# Patient Record
Sex: Male | Born: 1950
Health system: Southern US, Community
[De-identification: ages and names within clinical notes are randomized; demographics above are authoritative.]

---

## 1999-07-07 ENCOUNTER — Encounter: Admission: RE | Admit: 1999-07-07 | Discharge: 1999-07-07 | Payer: Self-pay | Admitting: *Deleted

## 2006-01-09 ENCOUNTER — Emergency Department (HOSPITAL_COMMUNITY): Admission: EM | Admit: 2006-01-09 | Discharge: 2006-01-09 | Payer: Self-pay | Admitting: Emergency Medicine

## 2008-09-01 ENCOUNTER — Encounter: Admission: RE | Admit: 2008-09-01 | Discharge: 2008-09-01 | Payer: Self-pay | Admitting: Chiropractic Medicine

## 2009-05-31 ENCOUNTER — Emergency Department (HOSPITAL_COMMUNITY): Admission: EM | Admit: 2009-05-31 | Discharge: 2009-05-31 | Payer: Self-pay | Admitting: Emergency Medicine

## 2009-05-31 ENCOUNTER — Emergency Department (HOSPITAL_COMMUNITY): Admission: EM | Admit: 2009-05-31 | Discharge: 2009-05-31 | Payer: Self-pay | Admitting: Family Medicine
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
## 2016-02-03 ENCOUNTER — Ambulatory Visit (INDEPENDENT_AMBULATORY_CARE_PROVIDER_SITE_OTHER): Payer: Managed Care, Other (non HMO) | Admitting: Family Medicine

## 2016-02-03 ENCOUNTER — Encounter: Payer: Self-pay | Admitting: Family Medicine

## 2016-02-03 VITALS — BP 135/77 | HR 78 | Temp 97.5°F | Ht 68.5 in | Wt 210.2 lb

## 2016-02-03 DIAGNOSIS — Z0000 Encounter for general adult medical examination without abnormal findings: Secondary | ICD-10-CM

## 2016-02-03 NOTE — Progress Notes (Signed)
   Subjective:    Patient ID: Ricky Garcia, male    DOB: 01/29/1951, 65 y.o.   MRN: [id number]  HPI 65 year old gentleman who is here for a physical. He is amazingly healthy at 8265. He is on no medicines has no chronic diseases. He had his gallbladder out about one year ago and has had no problems since then. Family history is negative for chronic disease or cancers. He would like to have his cholesterol blood sugar checked today but he has eaten recently so will put that off until tomorrow. He has a blood pressure cuff at home and some of the readings have been high and he is concerned about that but pressure today was good.  There are no active problems to display for this patient.  No outpatient encounter prescriptions on file as of 02/03/2016.   No facility-administered encounter medications on file as of 02/03/2016.      Review of Systems  Constitutional: Negative.   HENT: Negative.   Eyes: Negative.   Respiratory: Negative.  Negative for shortness of breath.   Cardiovascular: Negative.  Negative for chest pain and leg swelling.  Gastrointestinal: Negative.   Genitourinary: Negative.   Musculoskeletal: Negative.   Skin: Negative.   Neurological: Negative.   Psychiatric/Behavioral: Negative.   All other systems reviewed and are negative.      Objective:   Physical Exam  Constitutional: He is oriented to person, place, and time. He appears well-developed and well-nourished.  HENT:  Head: Normocephalic.  Right Ear: External ear normal.  Left Ear: External ear normal.  Nose: Nose normal.  Mouth/Throat: Oropharynx is clear and moist.  Eyes: Conjunctivae and EOM are normal. Pupils are equal, round, and reactive to light.  Neck: Normal range of motion. Neck supple.  Cardiovascular: Normal rate, regular rhythm, normal heart sounds and intact distal pulses.   Pulmonary/Chest: Effort normal and breath sounds normal.  Abdominal: Soft. Bowel sounds are normal.  Musculoskeletal:  Normal range of motion.  Neurological: He is alert and oriented to person, place, and time.  Skin: Skin is warm and dry.  Psychiatric: He has a normal mood and affect. His behavior is normal. Judgment and thought content normal.          Assessment & Plan:  1. Physical exam Except for being a little overweight, patient appears amazingly healthy. Blood pressure is 135/75. He did have colonoscopy about 2 years ago and was told to come back in 10 years. Will do baseline labs a.m., fasting

## 2016-02-04 ENCOUNTER — Other Ambulatory Visit: Payer: Managed Care, Other (non HMO)

## 2016-02-05 LAB — CMP14+EGFR
A/G RATIO: 1.4 (ref 1.2–2.2)
ALBUMIN: 4.1 g/dL (ref 3.6–4.8)
ALT: 19 IU/L (ref 0–44)
AST: 19 IU/L (ref 0–40)
Alkaline Phosphatase: 92 IU/L (ref 39–117)
BUN / CREAT RATIO: 12 (ref 10–22)
BUN: 13 mg/dL (ref 8–27)
Bilirubin Total: 0.5 mg/dL (ref 0.0–1.2)
CALCIUM: 9.4 mg/dL (ref 8.6–10.2)
CO2: 24 mmol/L (ref 18–29)
CREATININE: 1.09 mg/dL (ref 0.76–1.27)
Chloride: 103 mmol/L (ref 96–106)
GFR, EST AFRICAN AMERICAN: 82 mL/min/{1.73_m2} (ref >59)
GFR, EST NON AFRICAN AMERICAN: 71 mL/min/{1.73_m2} (ref >59)
GLOBULIN, TOTAL: 2.9 g/dL (ref 1.5–4.5)
Glucose: 94 mg/dL (ref 65–99)
POTASSIUM: 4.8 mmol/L (ref 3.5–5.2)
SODIUM: 140 mmol/L (ref 134–144)
Total Protein: 7 g/dL (ref 6.0–8.5)

## 2016-02-05 LAB — LIPID PANEL
CHOL/HDL RATIO: 6.7 ratio — AB (ref 0.0–5.0)
Cholesterol, Total: 208 mg/dL — ABNORMAL HIGH (ref 100–199)
HDL: 31 mg/dL — ABNORMAL LOW (ref >39)
LDL CALC: 137 mg/dL — AB (ref 0–99)
TRIGLYCERIDES: 202 mg/dL — AB (ref 0–149)
VLDL Cholesterol Cal: 40 mg/dL (ref 5–40)

## 2016-02-05 LAB — PSA, TOTAL AND FREE
PROSTATE SPECIFIC AG, SERUM: 1.1 ng/mL (ref 0.0–4.0)
PSA FREE PCT: 12.7 %
PSA FREE: 0.14 ng/mL

## 2016-02-08 ENCOUNTER — Telehealth: Payer: Self-pay | Admitting: Family Medicine

## 2018-03-20 ENCOUNTER — Ambulatory Visit (INDEPENDENT_AMBULATORY_CARE_PROVIDER_SITE_OTHER): Payer: Managed Care, Other (non HMO) | Admitting: Family

## 2018-03-20 ENCOUNTER — Encounter: Payer: Self-pay | Admitting: Family

## 2018-03-20 VITALS — BP 140/87 | HR 75 | Temp 97.3°F | Ht 68.5 in | Wt 212.8 lb

## 2018-03-20 DIAGNOSIS — Z23 Encounter for immunization: Secondary | ICD-10-CM

## 2018-03-20 DIAGNOSIS — R030 Elevated blood-pressure reading, without diagnosis of hypertension: Secondary | ICD-10-CM

## 2018-03-20 DIAGNOSIS — Z0000 Encounter for general adult medical examination without abnormal findings: Secondary | ICD-10-CM

## 2018-03-20 DIAGNOSIS — R42 Dizziness and giddiness: Secondary | ICD-10-CM

## 2018-03-20 NOTE — Addendum Note (Signed)
Addended by: Almeta Monas on: 03/20/2018 10:48 AM   Modules accepted: Orders

## 2018-03-20 NOTE — Patient Instructions (Addendum)
Hypertension Hypertension, commonly called high blood pressure, is when the force of blood pumping through the arteries is too strong. The arteries are the blood vessels that carry blood from the heart throughout the body. Hypertension forces the heart to work harder to pump blood and may cause arteries to become narrow or stiff. Having untreated or uncontrolled hypertension can cause heart attacks, strokes, kidney disease, and other problems. A blood pressure reading consists of a higher number over a lower number. Ideally, your blood pressure should be below 120/80. The first ("top") number is called the systolic pressure. It is a measure of the pressure in your arteries as your heart beats. The second ("bottom") number is called the diastolic pressure. It is a measure of the pressure in your arteries as the heart relaxes. What are the causes? The cause of this condition is not known. What increases the risk? Some risk factors for high blood pressure are under your control. Others are not. Factors you can change  Smoking.  Having type 2 diabetes mellitus, high cholesterol, or both.  Not getting enough exercise or physical activity.  Being overweight.  Having too much fat, sugar, calories, or salt (sodium) in your diet.  Drinking too much alcohol. Factors that are difficult or impossible to change  Having chronic kidney disease.  Having a family history of high blood pressure.  Age. Risk increases with age.  Race. You may be at higher risk if you are African-American.  Gender. Men are at higher risk than women before age 45. After age 65, women are at higher risk than men.  Having obstructive sleep apnea.  Stress. What are the signs or symptoms? Extremely high blood pressure (hypertensive crisis) may cause:  Headache.  Anxiety.  Shortness of breath.  Nosebleed.  Nausea and vomiting.  Severe chest pain.  Jerky movements you cannot control (seizures).  How is this  diagnosed? This condition is diagnosed by measuring your blood pressure while you are seated, with your arm resting on a surface. The cuff of the blood pressure monitor will be placed directly against the skin of your upper arm at the level of your heart. It should be measured at least twice using the same arm. Certain conditions can cause a difference in blood pressure between your right and left arms. Certain factors can cause blood pressure readings to be lower or higher than normal (elevated) for a short period of time:  When your blood pressure is higher when you are in a health care provider's office than when you are at home, this is called white coat hypertension. Most people with this condition do not need medicines.  When your blood pressure is higher at home than when you are in a health care provider's office, this is called masked hypertension. Most people with this condition may need medicines to control blood pressure.  If you have a high blood pressure reading during one visit or you have normal blood pressure with other risk factors:  You may be asked to return on a different day to have your blood pressure checked again.  You may be asked to monitor your blood pressure at home for 1 week or longer.  If you are diagnosed with hypertension, you may have other blood or imaging tests to help your health care provider understand your overall risk for other conditions. How is this treated? This condition is treated by making healthy lifestyle changes, such as eating healthy foods, exercising more, and reducing your alcohol intake. Your   health care provider may prescribe medicine if lifestyle changes are not enough to get your blood pressure under control, and if:  Your systolic blood pressure is above 130.  Your diastolic blood pressure is above 80.  Your personal target blood pressure may vary depending on your medical conditions, your age, and other factors. Follow these  instructions at home: Eating and drinking  Eat a diet that is high in fiber and potassium, and low in sodium, added sugar, and fat. An example eating plan is called the DASH (Dietary Approaches to Stop Hypertension) diet. To eat this way: ? Eat plenty of fresh fruits and vegetables. Try to fill half of your plate at each meal with fruits and vegetables. ? Eat whole grains, such as whole wheat pasta, brown rice, or whole grain bread. Fill about one quarter of your plate with whole grains. ? Eat or drink low-fat dairy products, such as skim milk or low-fat yogurt. ? Avoid fatty cuts of meat, processed or cured meats, and poultry with skin. Fill about one quarter of your plate with lean proteins, such as fish, chicken without skin, beans, eggs, and tofu. ? Avoid premade and processed foods. These tend to be higher in sodium, added sugar, and fat.  Reduce your daily sodium intake. Most people with hypertension should eat less than 1,500 mg of sodium a day.  Limit alcohol intake to no more than 1 drink a day for nonpregnant women and 2 drinks a day for men. One drink equals 12 oz of beer, 5 oz of wine, or 1 oz of hard liquor. Lifestyle  Work with your health care provider to maintain a healthy body weight or to lose weight. Ask what an ideal weight is for you.  Get at least 30 minutes of exercise that causes your heart to beat faster (aerobic exercise) most days of the week. Activities may include walking, swimming, or biking.  Include exercise to strengthen your muscles (resistance exercise), such as pilates or lifting weights, as part of your weekly exercise routine. Try to do these types of exercises for 30 minutes at least 3 days a week.  Do not use any products that contain nicotine or tobacco, such as cigarettes and e-cigarettes. If you need help quitting, ask your health care provider.  Monitor your blood pressure at home as told by your health care provider.  Keep all follow-up visits as  told by your health care provider. This is important. Medicines  Take over-the-counter and prescription medicines only as told by your health care provider. Follow directions carefully. Blood pressure medicines must be taken as prescribed.  Do not skip doses of blood pressure medicine. Doing this puts you at risk for problems and can make the medicine less effective.  Ask your health care provider about side effects or reactions to medicines that you should watch for. Contact a health care provider if:  You think you are having a reaction to a medicine you are taking.  You have headaches that keep coming back (recurring).  You feel dizzy.  You have swelling in your ankles.  You have trouble with your vision. Get help right away if:  You develop a severe headache or confusion.  You have unusual weakness or numbness.  You feel faint.  You have severe pain in your chest or abdomen.  You vomit repeatedly.  You have trouble breathing. Summary  Hypertension is when the force of blood pumping through your arteries is too strong. If this condition is not   controlled, it may put you at risk for serious complications.  Your personal target blood pressure may vary depending on your medical conditions, your age, and other factors. For most people, a normal blood pressure is less than 120/80.  Hypertension is treated with lifestyle changes, medicines, or a combination of both. Lifestyle changes include weight loss, eating a healthy, low-sodium diet, exercising more, and limiting alcohol. This information is not intended to replace advice given to you by your health care provider. Make sure you discuss any questions you have with your health care provider. Document Released: 10/22/2005 Document Revised: 09/19/2016 Document Reviewed: 09/19/2016 Elsevier Interactive Patient Education  2018 [employer]. Vertigo Vertigo is the feeling that you or your surroundings are moving when they are  not. Vertigo can be dangerous if it occurs while you are doing something that could endanger you or others, such as driving. What are the causes? This condition is caused by a disturbance in the signals that are sent by your body's sensory systems to your brain. Different causes of a disturbance can lead to vertigo, including:  Infections, especially in the inner ear.  A bad reaction to a drug, or misuse of alcohol and medicines.  Withdrawal from drugs or alcohol.  Quickly changing positions, as when lying down or rolling over in bed.  Migraine headaches.  Decreased blood flow to the brain.  Decreased blood pressure.  Increased pressure in the brain from a head or neck injury, stroke, infection, tumor, or bleeding.  Central nervous system disorders.  What are the signs or symptoms? Symptoms of this condition usually occur when you move your head or your eyes in different directions. Symptoms may start suddenly, and they usually last for less than a minute. Symptoms may include:  Loss of balance and falling.  Feeling like you are spinning or moving.  Feeling like your surroundings are spinning or moving.  Nausea and vomiting.  Blurred vision or double vision.  Difficulty hearing.  Slurred speech.  Dizziness.  Involuntary eye movement (nystagmus).  Symptoms can be mild and cause only slight annoyance, or they can be severe and interfere with daily life. Episodes of vertigo may return (recur) over time, and they are often triggered by certain movements. Symptoms may improve over time. How is this diagnosed? This condition may be diagnosed based on medical history and the quality of your nystagmus. Your health care provider may test your eye movements by asking you to quickly change positions to trigger the nystagmus. This may be called the Dix-Hallpike test, head thrust test, or roll test. You may be referred to a health care provider who specializes in ear, nose, and throat  (ENT) problems (otolaryngologist) or a provider who specializes in disorders of the central nervous system (neurologist). You may have additional testing, including:  A physical exam.  Blood tests.  MRI.  A CT scan.  An electrocardiogram (ECG). This records electrical activity in your heart.  An electroencephalogram (EEG). This records electrical activity in your brain.  Hearing tests.  How is this treated? Treatment for this condition depends on the cause and the severity of the symptoms. Treatment options include:  Medicines to treat nausea or vertigo. These are usually used for severe cases. Some medicines that are used to treat other conditions may also reduce or eliminate vertigo symptoms. These include: ? Medicines that control allergies (antihistamines). ? Medicines that control seizures (anticonvulsants). ? Medicines that relieve depression (antidepressants). ? Medicines that relieve anxiety (sedatives).  Head movements to adjust  your inner ear back to normal. If your vertigo is caused by an ear problem, your health care provider may recommend certain movements to correct the problem.  Surgery. This is rare.  Follow these instructions at home: Safety  Move slowly.Avoid sudden body or head movements.  Avoid driving.  Avoid operating heavy machinery.  Avoid doing any tasks that would cause danger to you or others if you would have a vertigo episode during the task.  If you have trouble walking or keeping your balance, try using a cane for stability. If you feel dizzy or unstable, sit down right away.  Return to your normal activities as told by your health care provider. Ask your health care provider what activities are safe for you. General instructions  Take over-the-counter and prescription medicines only as told by your health care provider.  Avoid certain positions or movements as told by your health care provider.  Drink enough fluid to keep your urine  clear or pale yellow.  Keep all follow-up visits as told by your health care provider. This is important. Contact a health care provider if:  Your medicines do not relieve your vertigo or they make it worse.  You have a fever.  Your condition gets worse or you develop new symptoms.  Your family or friends notice any behavioral changes.  Your nausea or vomiting gets worse.  You have numbness or a "pins and needles" sensation in part of your body. Get help right away if:  You have difficulty moving or speaking.  You are always dizzy.  You faint.  You develop severe headaches.  You have weakness in your hands, arms, or legs.  You have changes in your hearing or vision.  You develop a stiff neck.  You develop sensitivity to light. This information is not intended to replace advice given to you by your health care provider. Make sure you discuss any questions you have with your health care provider. Document Released: 08/01/2005 Document Revised: 04/04/2016 Document Reviewed: 02/14/2015 Elsevier Interactive Patient Education  [employer].

## 2018-03-20 NOTE — Progress Notes (Signed)
Subjective:    Patient ID: DONTELL MIAN, male    DOB: 03/22/1951, 67 y.o.   MRN: [id number]  Chief Complaint  Patient presents with  . Annual Exam   PT presents to the office today for CPE. PT currently not taking any medications. Pt denies any headache, palpitations, SOB, or edema at this time.  Dizziness  This is a new problem. The current episode started more than 1 month ago. The problem occurs intermittently. The problem has been waxing and waning. Associated symptoms include vertigo. Pertinent negatives include no change in bowel habit, chest pain, diaphoresis, headaches, myalgias, visual change or vomiting. The symptoms are aggravated by bending. He has tried nothing for the symptoms. The treatment provided no relief.       Review of Systems  Constitutional: Negative for diaphoresis.  Cardiovascular: Negative for chest pain.  Gastrointestinal: Negative for change in bowel habit and vomiting.  Musculoskeletal: Negative for myalgias.  Neurological: Positive for dizziness and vertigo. Negative for headaches.  All other systems reviewed and are negative.  History reviewed. No pertinent family history.  Social History   Socioeconomic History  . Marital status: Divorced    Spouse name: Not on file  . Number of children: Not on file  . Years of education: Not on file  . Highest education level: Not on file  Occupational History  . Not on file  Social Needs  . Financial resource strain: Not on file  . Food insecurity:    Worry: Not on file    Inability: Not on file  . Transportation needs:    Medical: Not on file    Non-medical: Not on file  Tobacco Use  . Smoking status: Never Smoker  . Smokeless tobacco: Never Used  Substance and Sexual Activity  . Alcohol use: Yes    Comment: rare  . Drug use: No  . Sexual activity: Not on file  Lifestyle  . Physical activity:    Days per week: Not on file    Minutes per session: Not on file  . Stress: Not on file    Relationships  . Social connections:    Talks on phone: Not on file    Gets together: Not on file    Attends religious service: Not on file    Active member of club or organization: Not on file    Attends meetings of clubs or organizations: Not on file    Relationship status: Not on file  Other Topics Concern  . Not on file  Social History Narrative  . Not on file       Objective:   Physical Exam  Constitutional: He is oriented to person, place, and time. He appears well-developed and well-nourished. No distress.  HENT:  Head: Normocephalic.  Right Ear: External ear normal.  Left Ear: External ear normal.  Mouth/Throat: Oropharynx is clear and moist.  Eyes: Pupils are equal, round, and reactive to light. Right eye exhibits no discharge. Left eye exhibits no discharge.  Neck: Normal range of motion. Neck supple. No thyromegaly present.  Cardiovascular: Normal rate, regular rhythm, normal heart sounds and intact distal pulses.  No murmur heard. Pulmonary/Chest: Effort normal and breath sounds normal. No respiratory distress. He has no wheezes.  Abdominal: Soft. Bowel sounds are normal. He exhibits no distension. There is no tenderness.  Musculoskeletal: Normal range of motion. He exhibits no edema or tenderness.  Neurological: He is alert and oriented to person, place, and time. He has normal reflexes. No  cranial nerve deficit.  Skin: Skin is warm and dry. No rash noted. No erythema.  Psychiatric: He has a normal mood and affect. His behavior is normal. Judgment and thought content normal.  Vitals reviewed.     BP 140/87   Pulse 75   Temp (!) 97.3 F (36.3 C) (Oral)   Ht 5' 8.5" (1.74 m)   Wt 212 lb 12.8 oz (96.5 kg) Comment: Waist circumference 41 inches  BMI 31.89 kg/m      Assessment & Plan:  ABIGAIL MARSIGLIA comes in today with chief complaint of Annual Exam   Diagnosis and orders addressed:  1. Annual physical exam - CMP14+EGFR - CBC with  Differential/Platelet - Lipid panel - Hepatitis C antibody - TSH - PSA, total and free  2. Elevated blood pressure reading Discussed diet and exercise  Low salt diet - CMP14+EGFR - CBC with Differential/Platelet  3. Vertigo Avoid position changes  Fall preventions discussed   Labs pending Health Maintenance reviewed Diet and exercise encouraged  Follow up plan: 1 year   Evelina Dun, FNP

## 2018-03-22 LAB — CBC WITH DIFFERENTIAL/PLATELET
BASOS ABS: 0.1 10*3/uL (ref 0.0–0.2)
Basos: 1 %
EOS (ABSOLUTE): 0.4 10*3/uL (ref 0.0–0.4)
Eos: 3 %
HEMATOCRIT: 44.1 % (ref 37.5–51.0)
HEMOGLOBIN: 14.8 g/dL (ref 13.0–17.7)
LYMPHS: 37 %
Lymphocytes Absolute: 4.3 10*3/uL — ABNORMAL HIGH (ref 0.7–3.1)
MCH: 30.5 pg (ref 26.6–33.0)
MCHC: 33.6 g/dL (ref 31.5–35.7)
MCV: 91 fL (ref 79–97)
MONOCYTES: 4 %
Monocytes Absolute: 0.5 10*3/uL (ref 0.1–0.9)
NEUTROS ABS: 6.4 10*3/uL (ref 1.4–7.0)
NEUTROS PCT: 55 %
Platelets: 421 10*3/uL — ABNORMAL HIGH (ref 150–379)
RBC: 4.85 x10E6/uL (ref 4.14–5.80)
RDW: 14.2 % (ref 12.3–15.4)
WBC: 11.7 10*3/uL — ABNORMAL HIGH (ref 3.4–10.8)

## 2018-03-22 LAB — CMP14+EGFR
ALT: 22 IU/L (ref 0–44)
AST: 23 IU/L (ref 0–40)
Albumin/Globulin Ratio: 1.4 (ref 1.2–2.2)
Albumin: 4.4 g/dL (ref 3.6–4.8)
Alkaline Phosphatase: 103 IU/L (ref 39–117)
BILIRUBIN TOTAL: 0.5 mg/dL (ref 0.0–1.2)
BUN/Creatinine Ratio: 16 (ref 10–24)
BUN: 19 mg/dL (ref 8–27)
CALCIUM: 9.2 mg/dL (ref 8.6–10.2)
CHLORIDE: 102 mmol/L (ref 96–106)
CO2: 20 mmol/L (ref 20–29)
Creatinine, Ser: 1.2 mg/dL (ref 0.76–1.27)
GFR, EST AFRICAN AMERICAN: 72 mL/min/{1.73_m2} (ref >59)
GFR, EST NON AFRICAN AMERICAN: 62 mL/min/{1.73_m2} (ref >59)
GLUCOSE: 129 mg/dL — AB (ref 65–99)
Globulin, Total: 3.1 g/dL (ref 1.5–4.5)
Potassium: 4.3 mmol/L (ref 3.5–5.2)
Sodium: 140 mmol/L (ref 134–144)
TOTAL PROTEIN: 7.5 g/dL (ref 6.0–8.5)

## 2018-03-22 LAB — LIPID PANEL
CHOL/HDL RATIO: 6.1 ratio — AB (ref 0.0–5.0)
CHOLESTEROL TOTAL: 194 mg/dL (ref 100–199)
HDL: 32 mg/dL — AB (ref >39)
LDL Calculated: 127 mg/dL — ABNORMAL HIGH (ref 0–99)
TRIGLYCERIDES: 176 mg/dL — AB (ref 0–149)
VLDL Cholesterol Cal: 35 mg/dL (ref 5–40)

## 2018-03-22 LAB — PSA, TOTAL AND FREE
PROSTATE SPECIFIC AG, SERUM: 1 ng/mL (ref 0.0–4.0)
PSA, Free Pct: 17 %
PSA, Free: 0.17 ng/mL

## 2018-03-22 LAB — HEPATITIS C ANTIBODY: Hep C Virus Ab: <0.1 s/co ratio (ref 0.0–0.9)

## 2018-03-22 LAB — TSH: TSH: 3.71 u[IU]/mL (ref 0.450–4.500)

## 2018-03-24 ENCOUNTER — Other Ambulatory Visit: Payer: Self-pay | Admitting: Family

## 2018-03-24 DIAGNOSIS — E785 Hyperlipidemia, unspecified: Secondary | ICD-10-CM | POA: Insufficient documentation

## 2018-03-24 MED ORDER — ATORVASTATIN CALCIUM 20 MG PO TABS: 20.0000 mg | ORAL_TABLET | Freq: Every day | ORAL | 3 refills | Status: DC

## 2018-03-26 LAB — HGB A1C W/O EAG: HEMOGLOBIN A1C: 5.4 % (ref 4.8–5.6)

## 2018-03-26 LAB — SPECIMEN STATUS REPORT

## 2018-04-23 ENCOUNTER — Ambulatory Visit (INDEPENDENT_AMBULATORY_CARE_PROVIDER_SITE_OTHER): Payer: Managed Care, Other (non HMO) | Admitting: Family Medicine

## 2018-04-23 ENCOUNTER — Ambulatory Visit (INDEPENDENT_AMBULATORY_CARE_PROVIDER_SITE_OTHER): Payer: Managed Care, Other (non HMO)

## 2018-04-23 ENCOUNTER — Encounter: Payer: Self-pay | Admitting: Family Medicine

## 2018-04-23 VITALS — BP 146/86 | HR 75 | Temp 99.1°F | Ht 68.0 in | Wt 213.0 lb

## 2018-04-23 DIAGNOSIS — M542 Cervicalgia: Secondary | ICD-10-CM | POA: Diagnosis not present

## 2018-04-23 DIAGNOSIS — R0781 Pleurodynia: Secondary | ICD-10-CM | POA: Diagnosis not present

## 2018-04-23 DIAGNOSIS — M545 Low back pain, unspecified: Secondary | ICD-10-CM

## 2018-04-23 MED ORDER — TIZANIDINE HCL 4 MG PO TABS: 2.0000 mg | ORAL_TABLET | Freq: Three times a day (TID) | ORAL | 0 refills | Status: DC | PRN

## 2018-04-23 MED ORDER — NAPROXEN 500 MG PO TABS: 500.0000 mg | ORAL_TABLET | Freq: Two times a day (BID) | ORAL | 0 refills | Status: DC
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
## 2018-04-23 NOTE — Patient Instructions (Addendum)
You have prescribed a nonsteroidal anti-inflammatory drug (NSAID) today. This will help with ear pain and inflammation. Please do not take any other NSAIDs (ibuprofen/Motrin/Advil, naproxen/Aleve, meloxicam/Mobic, Voltaren/diclofenac). Please make sure to eat a meal when taking this medication.   Caution:  If you have a history of acid reflux/indigestion, I recommend that you take an antacid (such as Prilosec, Prevacid) daily while on the NSAID.  If you have a history of bleeding disorder, gastric ulcer, are on a blood thinner (like warfarin/Coumadin, Xarelto, Eliquis, etc) please do not take NSAID.  If you have ever had a heart attack, you should not take NSAIDs.   Low Back Sprain A sprain is a stretch or tear in the bands of tissue that hold bones and joints together (ligaments). Sprains of the lower back (lumbar spine) are a common cause of low back pain. A sprain occurs when ligaments are overextended or stretched beyond their limits. The ligaments can become inflamed, resulting in pain and sudden muscle tightening (spasms). A sprain can be caused by an injury (trauma), or it can develop gradually due to overuse. There are three types of sprains:  Grade 1 is a mild sprain involving an overstretched ligament or a very slight tear of the ligament.  Grade 2 is a moderate sprain involving a partial tear of the ligament.  Grade 3 is a severe sprain involving a complete tear of the ligament.  What are the causes? This condition may be caused by:  Trauma, such as a fall or a hit to the body.  Twisting or overstretching the back. This may result from doing activities that require a lot of energy, such as lifting heavy objects.  What increases the risk? The following factors may increase your risk of getting this condition:  Playing contact sports.  Participating in sports or activities that put excessive stress on the back and require a lot of bending and twisting, including: ? Lifting  weights or heavy objects. ? Gymnastics. ? Soccer. ? Figure skating. ? Snowboarding.  Being overweight or obese.  Having poor strength and flexibility.  What are the signs or symptoms? Symptoms of this condition may include:  Sharp or dull pain in the lower back that does not go away. Pain may extend to the buttocks.  Stiffness.  Limited range of motion.  Inability to stand up straight due to stiffness or pain.  Muscle spasms.  How is this diagnosed?  This condition may be diagnosed based on:  Your symptoms.  Your medical history.  A physical exam. ? Your health care provider may push on certain areas of your back to determine the source of your pain. ? You may be asked to bend forward, backward, and side to side to assess the severity of your pain and your range of motion.  Imaging tests, such as: ? X-rays. ? MRI.  How is this treated? Treatment for this condition may include:  Applying heat and cold to the affected area.  Medicines to help relieve pain and to relax your muscles (muscle relaxants).  NSAIDs to help reduce swelling and discomfort.  Physical therapy.  When your symptoms improve, it is important to gradually return to your normal routine as soon as possible to reduce pain, avoid stiffness, and avoid loss of muscle strength. Generally, symptoms should improve within 6 weeks of treatment. However, recovery time varies. Follow these instructions at home: Managing pain, stiffness, and swelling  If directed, apply ice to the injured area during the first 24 hours after  your injury. ? Put ice in a plastic bag. ? Place a towel between your skin and the bag. ? Leave the ice on for 20 minutes, 2-3 times a day.  If directed, apply heat to the affected area as often as told by your health care provider. Use the heat source that your health care provider recommends, such as a moist heat pack or a heating pad. ? Place a towel between your skin and the heat  source. ? Leave the heat on for 20-30 minutes. ? Remove the heat if your skin turns bright red. This is especially important if you are unable to feel pain, heat, or cold. You may have a greater risk of getting burned. Activity  Rest and return to your normal activities as told by your health care provider. Ask your health care provider what activities are safe for you.  Avoid activities that take a lot of effort (are strenuous) for as long as told by your health care provider.  Do exercises as told by your health care provider. General instructions   Take over-the-counter and prescription medicines only as told by your health care provider.  If you have questions or concerns about safety while taking pain medicine, talk with your health care provider.  Do not drive or operate heavy machinery until you know how your pain medicine affects you.  Do not use any tobacco products, such as cigarettes, chewing tobacco, and e-cigarettes. Tobacco can delay bone healing. If you need help quitting, ask your health care provider.  Keep all follow-up visits as told by your health care provider. This is important. How is this prevented?  Warm up and stretch before being active.  Cool down and stretch after being active.  Give your body time to rest between periods of activity.  Avoid: ? Being physically inactive for long periods at a time. ? Exercising or playing sports when you are tired or in pain.  Use correct form when playing sports and lifting heavy objects.  Use good posture when sitting and standing.  Maintain a healthy weight.  Sleep on a mattress with medium firmness to support your back.  Make sure to use equipment that fits you, including shoes that fit well.  Be safe and responsible while being active to avoid falls.  Do at least 150 minutes of moderate-intensity exercise each week, such as brisk walking or water aerobics. Try a form of exercise that takes stress off your  back, such as swimming or stationary cycling.  Maintain physical fitness, including: ? Strength. In particular, develop and maintain strong abdominal muscles. ? Flexibility. ? Cardiovascular fitness. ? Endurance. Contact a health care provider if:  Your back pain does not improve after 6 weeks of treatment.  Your symptoms get worse. Get help right away if:  Your back pain is severe.  You are unable to stand or walk.  You develop pain in your legs.  You develop weakness in your buttocks or legs.  You have difficulty controlling when you urinate or when you have a bowel movement. This information is not intended to replace advice given to you by your health care provider. Make sure you discuss any questions you have with your health care provider. Document Released: 10/22/2005 Document Revised: 06/28/2016 Document Reviewed: 08/03/2015 Elsevier Interactive Patient Education  2018 [employer] Cervical Sprain A cervical sprain is a stretch or tear in the tissues that connect bones (ligaments) in the neck. Most neck (cervical) sprains get better in 4-6 weeks. Follow  these instructions at home: If you have a neck collar:  Wear it as told by your doctor. Do not take off (do not remove) the collar unless your doctor says that this is safe.  Ask your doctor before adjusting your collar.  If you have long hair, keep it outside of the collar.  Ask your doctor if you may take off the collar for cleaning and bathing. If you may take off the collar: ? Follow instructions from your doctor about how to take off the collar safely. ? Clean the collar by wiping it with mild soap and water. Let it air-dry all the way. ? If your collar has removable pads:  Take the pads out every 1-2 days.  Hand wash the pads with soap and water.  Let the pads air-dry all the way before you put them back in the collar. Do not dry them in a clothes dryer. Do not dry them with a hair dryer. ? Check your  skin under the collar for irritation or sores. If you see any, tell your doctor. Managing pain, stiffness, and swelling  Use a cervical traction device, if told by your doctor.  If told, put heat on the affected area. Do this before exercises (physical therapy) or as often as told by your doctor. Use the heat source that your doctor recommends, such as a moist heat pack or a heating pad. ? Place a towel between your skin and the heat source. ? Leave the heat on for 20-30 minutes. ? Take the heat off (remove the heat) if your skin turns bright red. This is very important if you cannot feel pain, heat, or cold. You may have a greater risk of getting burned.  Put ice on the affected area. ? Put ice in a plastic bag. ? Place a towel between your skin and the bag. ? Leave the ice on for 20 minutes, 2-3 times a day. Activity  Do not drive while wearing a neck collar. If you do not have a neck collar, ask your doctor if it is safe to drive.  Do not drive or use heavy machinery while taking prescription pain medicine or muscle relaxants, unless your doctor approves.  Do not lift anything that is heavier than 10 lb (4.5 kg) until your doctor tells you that it is safe.  Rest as told by your doctor.  Avoid activities that make you feel worse. Ask your doctor what activities are safe for you.  Do exercises as told by your doctor or physical therapist. Preventing neck sprain  Practice good posture. Adjust your workstation to help with this, if needed.  Exercise regularly as told by your doctor or physical therapist.  Avoid activities that are risky or may cause a neck sprain (cervical sprain). General instructions  Take over-the-counter and prescription medicines only as told by your doctor.  Do not use any products that contain nicotine or tobacco. This includes cigarettes and e-cigarettes. If you need help quitting, ask your doctor.  Keep all follow-up visits as told by your doctor. This  is important. Contact a doctor if:  You have pain or other symptoms that get worse.  You have symptoms that do not get better after 2 weeks.  You have pain that does not get better with medicine.  You start to have new, unexplained symptoms.  You have sores or irritated skin from wearing your neck collar. Get help right away if:  You have very bad pain.  You have any  of the following in any part of your body: ? Loss of feeling (numbness). ? Tingling. ? Weakness.  You cannot move a part of your body (you have paralysis).  Your activity level does not improve. Summary  A cervical sprain is a stretch or tear in the tissues that connect bones (ligaments) in the neck.  If you have a neck (cervical) collar, do not take off the collar unless your doctor says that this is safe.  Put ice on affected areas as told by your doctor.  Put heat on affected areas as told by your doctor.  Good posture and regular exercise can help prevent a neck sprain from happening again. This information is not intended to replace advice given to you by your health care provider. Make sure you discuss any questions you have with your health care provider. Document Released: 04/09/2008 Document Revised: 07/03/2016 Document Reviewed: 07/03/2016 Elsevier Interactive Patient Education  2017 [employer].

## 2018-04-28 ENCOUNTER — Telehealth: Payer: Self-pay | Admitting: Family

## 2018-04-28 DIAGNOSIS — G8929 Other chronic pain: Secondary | ICD-10-CM

## 2018-04-28 DIAGNOSIS — M545 Low back pain, unspecified: Secondary | ICD-10-CM

## 2018-04-28 DIAGNOSIS — Z0289 Encounter for other administrative examinations: Secondary | ICD-10-CM

## 2018-04-29 NOTE — Telephone Encounter (Signed)
Referral placed.

## 2018-05-02 ENCOUNTER — Telehealth: Payer: Self-pay | Admitting: Family
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
## 2018-05-07 ENCOUNTER — Ambulatory Visit (INDEPENDENT_AMBULATORY_CARE_PROVIDER_SITE_OTHER): Payer: Managed Care, Other (non HMO) | Admitting: Family Medicine

## 2018-05-07 ENCOUNTER — Encounter: Payer: Self-pay | Admitting: Family Medicine

## 2018-05-07 VITALS — BP 139/89 | HR 70 | Temp 97.9°F | Ht 68.0 in | Wt 207.0 lb

## 2018-05-07 DIAGNOSIS — M542 Cervicalgia: Secondary | ICD-10-CM

## 2018-05-07 DIAGNOSIS — M545 Low back pain, unspecified: Secondary | ICD-10-CM | POA: Insufficient documentation

## 2018-05-07 MED ORDER — NAPROXEN 500 MG PO TABS: 500.0000 mg | ORAL_TABLET | Freq: Two times a day (BID) | ORAL | 0 refills | Status: DC

## 2018-05-07 MED ORDER — TIZANIDINE HCL 4 MG PO TABS: 2.0000 mg | ORAL_TABLET | Freq: Three times a day (TID) | ORAL | 0 refills | Status: DC | PRN
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
## 2018-05-07 NOTE — Patient Instructions (Signed)
Your medications have been refilled.  I have completed your paperwork for the FMLA.  You were given the paperwork to return to your job.  Follow-up with Jannifer Rodneyhristy Hawks in 4 weeks for recheck and extension of the FMLA paperwork if needed.  Make sure that the Chiropractor sends her notes so she knows how long your treatment with them will be.

## 2018-05-23 ENCOUNTER — Other Ambulatory Visit: Payer: Self-pay | Admitting: Family Medicine

## 2018-06-04 ENCOUNTER — Ambulatory Visit: Payer: Managed Care, Other (non HMO) | Admitting: Family

## 2018-06-10 ENCOUNTER — Encounter: Payer: Self-pay | Admitting: Family

## 2018-08-14 ENCOUNTER — Ambulatory Visit (INDEPENDENT_AMBULATORY_CARE_PROVIDER_SITE_OTHER): Payer: Managed Care, Other (non HMO) | Admitting: Family

## 2018-08-14 ENCOUNTER — Encounter: Payer: Self-pay | Admitting: Family

## 2018-08-14 VITALS — BP 152/85 | HR 72 | Temp 97.6°F | Ht 68.0 in | Wt 210.0 lb

## 2018-08-14 DIAGNOSIS — M545 Low back pain, unspecified: Secondary | ICD-10-CM

## 2018-08-14 MED ORDER — NAPROXEN 500 MG PO TABS: 500.0000 mg | ORAL_TABLET | Freq: Two times a day (BID) | ORAL | 0 refills | Status: DC

## 2018-08-14 MED ORDER — TIZANIDINE HCL 4 MG PO TABS
2.0000 mg | ORAL_TABLET | Freq: Three times a day (TID) | ORAL | 0 refills | Status: DC | PRN
Start: 2018-08-14 — End: 2019-04-10
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
## 2018-08-14 NOTE — Patient Instructions (Signed)

## 2018-08-25 ENCOUNTER — Telehealth: Payer: Self-pay | Admitting: Family

## 2018-08-25 MED ORDER — ATORVASTATIN CALCIUM 20 MG PO TABS: 20.0000 mg | ORAL_TABLET | Freq: Every day | ORAL | 3 refills | Status: DC

## 2018-08-25 NOTE — Telephone Encounter (Signed)
Prescription sent to pharmacy.

## 2018-08-25 NOTE — Telephone Encounter (Signed)
Annual CPE was done on 03-24-2018.  No  Mention of new blood pressure medication. Looks like lipitor may need a refill. Please advise.

## 2018-08-25 NOTE — Telephone Encounter (Signed)
PT states that we did not send in his BP medicne lipiosin? To [facility]

## 2018-09-03 ENCOUNTER — Other Ambulatory Visit: Payer: Self-pay | Admitting: Family

## 2018-09-04 ENCOUNTER — Telehealth: Payer: Self-pay | Admitting: *Deleted

## 2018-09-04 NOTE — Telephone Encounter (Signed)
Patient aware, script is ready, per message given on voice mail.  Referral person will call patient with update on physical therapy appointment.

## 2018-09-04 NOTE — Telephone Encounter (Signed)
Please call physical therapy ,next door, to schedule your appointment.  [phone]

## 2018-09-30 ENCOUNTER — Other Ambulatory Visit: Payer: Self-pay

## 2018-09-30 ENCOUNTER — Ambulatory Visit: Payer: Managed Care, Other (non HMO) | Attending: Family | Admitting: Physical Therapy

## 2018-09-30 DIAGNOSIS — M546 Pain in thoracic spine: Secondary | ICD-10-CM | POA: Insufficient documentation

## 2018-09-30 DIAGNOSIS — M545 Low back pain, unspecified: Secondary | ICD-10-CM
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
## 2018-10-07 ENCOUNTER — Ambulatory Visit: Payer: Managed Care, Other (non HMO) | Attending: Family | Admitting: *Deleted

## 2018-10-07 DIAGNOSIS — M546 Pain in thoracic spine: Secondary | ICD-10-CM | POA: Insufficient documentation

## 2018-10-07 DIAGNOSIS — M545 Low back pain, unspecified: Secondary | ICD-10-CM
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
## 2018-10-09 ENCOUNTER — Ambulatory Visit: Payer: Managed Care, Other (non HMO) | Admitting: Physical Therapy

## 2018-10-09 ENCOUNTER — Encounter: Payer: Self-pay | Admitting: Physical Therapy

## 2018-10-09 DIAGNOSIS — M546 Pain in thoracic spine: Secondary | ICD-10-CM

## 2018-10-09 DIAGNOSIS — M545 Low back pain, unspecified: Secondary | ICD-10-CM
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
## 2018-10-14 ENCOUNTER — Ambulatory Visit: Payer: Managed Care, Other (non HMO) | Admitting: *Deleted

## 2018-10-14 DIAGNOSIS — M545 Low back pain, unspecified: Secondary | ICD-10-CM

## 2018-10-14 DIAGNOSIS — M546 Pain in thoracic spine: Secondary | ICD-10-CM | POA: Diagnosis not present
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
## 2018-10-16 ENCOUNTER — Ambulatory Visit: Payer: Managed Care, Other (non HMO) | Admitting: *Deleted

## 2018-10-16 DIAGNOSIS — M546 Pain in thoracic spine: Secondary | ICD-10-CM

## 2018-10-16 DIAGNOSIS — M545 Low back pain, unspecified: Secondary | ICD-10-CM
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
## 2018-10-21 ENCOUNTER — Ambulatory Visit: Payer: Managed Care, Other (non HMO) | Admitting: *Deleted

## 2018-10-21 DIAGNOSIS — M545 Low back pain, unspecified: Secondary | ICD-10-CM

## 2018-10-21 DIAGNOSIS — M546 Pain in thoracic spine: Secondary | ICD-10-CM
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
## 2018-10-23 ENCOUNTER — Ambulatory Visit: Payer: Managed Care, Other (non HMO) | Admitting: *Deleted

## 2018-10-23 DIAGNOSIS — M545 Low back pain, unspecified: Secondary | ICD-10-CM

## 2018-10-23 DIAGNOSIS — M546 Pain in thoracic spine: Secondary | ICD-10-CM | POA: Diagnosis not present
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
## 2018-10-30 ENCOUNTER — Encounter: Payer: Self-pay | Admitting: Physical Therapy

## 2018-10-30 ENCOUNTER — Ambulatory Visit: Payer: Managed Care, Other (non HMO) | Admitting: Physical Therapy

## 2018-10-30 DIAGNOSIS — M545 Low back pain, unspecified: Secondary | ICD-10-CM

## 2018-10-30 DIAGNOSIS — M546 Pain in thoracic spine: Secondary | ICD-10-CM | POA: Diagnosis not present
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
## 2018-11-06 ENCOUNTER — Encounter: Payer: Self-pay | Admitting: Physical Therapy

## 2018-11-06 ENCOUNTER — Ambulatory Visit: Payer: Managed Care, Other (non HMO) | Attending: Family | Admitting: Physical Therapy

## 2018-11-06 DIAGNOSIS — M546 Pain in thoracic spine: Secondary | ICD-10-CM

## 2018-11-06 DIAGNOSIS — M545 Low back pain, unspecified: Secondary | ICD-10-CM
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
## 2018-11-11 ENCOUNTER — Encounter: Payer: Self-pay | Admitting: Physical Therapy

## 2018-11-11 ENCOUNTER — Ambulatory Visit: Payer: Managed Care, Other (non HMO) | Admitting: Physical Therapy

## 2018-11-11 DIAGNOSIS — M546 Pain in thoracic spine: Secondary | ICD-10-CM

## 2018-11-11 DIAGNOSIS — M545 Low back pain, unspecified: Secondary | ICD-10-CM
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
## 2018-11-13 ENCOUNTER — Encounter: Payer: Self-pay | Admitting: Physical Therapy

## 2018-11-13 ENCOUNTER — Ambulatory Visit: Payer: Managed Care, Other (non HMO) | Admitting: Physical Therapy

## 2018-11-13 DIAGNOSIS — M546 Pain in thoracic spine: Secondary | ICD-10-CM

## 2018-11-13 DIAGNOSIS — M545 Low back pain, unspecified: Secondary | ICD-10-CM
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
## 2018-11-18 ENCOUNTER — Ambulatory Visit: Payer: Managed Care, Other (non HMO) | Admitting: Physical Therapy

## 2018-11-20 ENCOUNTER — Ambulatory Visit: Payer: Managed Care, Other (non HMO) | Admitting: Physical Therapy

## 2018-12-02 ENCOUNTER — Encounter: Payer: Self-pay | Admitting: Physical Therapy

## 2018-12-02 ENCOUNTER — Ambulatory Visit: Payer: Managed Care, Other (non HMO) | Admitting: Physical Therapy

## 2018-12-02 DIAGNOSIS — M545 Low back pain, unspecified: Secondary | ICD-10-CM

## 2018-12-02 DIAGNOSIS — M546 Pain in thoracic spine: Secondary | ICD-10-CM
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
## 2019-04-10 ENCOUNTER — Ambulatory Visit (INDEPENDENT_AMBULATORY_CARE_PROVIDER_SITE_OTHER): Payer: Managed Care, Other (non HMO) | Admitting: Family Medicine

## 2019-04-10 ENCOUNTER — Other Ambulatory Visit: Payer: Self-pay

## 2019-04-10 ENCOUNTER — Encounter: Payer: Self-pay | Admitting: Family Medicine

## 2019-04-10 DIAGNOSIS — S39012A Strain of muscle, fascia and tendon of lower back, initial encounter: Secondary | ICD-10-CM

## 2019-04-10 MED ORDER — CYCLOBENZAPRINE HCL 10 MG PO TABS: 10.0000 mg | ORAL_TABLET | Freq: Three times a day (TID) | ORAL | 0 refills | Status: DC | PRN
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
## 2020-12-27 ENCOUNTER — Telehealth: Payer: Self-pay | Admitting: Family Medicine

## 2020-12-27 ENCOUNTER — Ambulatory Visit (INDEPENDENT_AMBULATORY_CARE_PROVIDER_SITE_OTHER): Payer: Medicare HMO | Admitting: Family

## 2020-12-27 ENCOUNTER — Encounter: Payer: Self-pay | Admitting: Family

## 2020-12-27 ENCOUNTER — Other Ambulatory Visit: Payer: Self-pay

## 2020-12-27 VITALS — BP 159/84 | HR 77 | Temp 98.0°F | Ht 68.0 in | Wt 214.4 lb

## 2020-12-27 DIAGNOSIS — Z23 Encounter for immunization: Secondary | ICD-10-CM | POA: Diagnosis not present

## 2020-12-27 DIAGNOSIS — M8949 Other hypertrophic osteoarthropathy, multiple sites: Secondary | ICD-10-CM | POA: Diagnosis not present

## 2020-12-27 DIAGNOSIS — Z0000 Encounter for general adult medical examination without abnormal findings: Secondary | ICD-10-CM

## 2020-12-27 DIAGNOSIS — E785 Hyperlipidemia, unspecified: Secondary | ICD-10-CM | POA: Diagnosis not present

## 2020-12-27 DIAGNOSIS — I10 Essential (primary) hypertension: Secondary | ICD-10-CM

## 2020-12-27 DIAGNOSIS — M159 Polyosteoarthritis, unspecified: Secondary | ICD-10-CM

## 2020-12-27 DIAGNOSIS — Z1211 Encounter for screening for malignant neoplasm of colon: Secondary | ICD-10-CM

## 2020-12-27 DIAGNOSIS — Z0001 Encounter for general adult medical examination with abnormal findings: Secondary | ICD-10-CM | POA: Diagnosis not present

## 2020-12-27 MED ORDER — DICLOFENAC SODIUM 75 MG PO TBEC: 75.0000 mg | DELAYED_RELEASE_TABLET | Freq: Two times a day (BID) | ORAL | 0 refills | Status: DC

## 2020-12-27 MED ORDER — LISINOPRIL 10 MG PO TABS: 10.0000 mg | ORAL_TABLET | Freq: Every day | ORAL | 3 refills | Status: DC

## 2020-12-27 NOTE — Progress Notes (Signed)
Subjective:    Patient ID: Ricky Garcia, male    DOB: Jul 20, 1951, 70 y.o.   MRN: [id number]  Chief Complaint  Patient presents with  . Annual Exam  . Shoulder Pain    Left shoulder. 2 mths comes and goes in right   . Hip Pain    Bilateral hip pain comes and goes    Pt presents to the office today for CPE.  Shoulder Pain  The pain is present in the right shoulder and left shoulder. This is a new problem. The current episode started more than 1 month ago. The problem has been waxing and waning. The quality of the pain is described as aching. The pain is at a severity of 4/10. The pain is mild. Associated symptoms include stiffness. Pertinent negatives include no joint swelling or numbness. He has tried nothing for the symptoms. The treatment provided no relief.  Hip Pain  The incident occurred more than 1 week ago. The pain is present in the left hip and right hip. The quality of the pain is described as aching. The pain is mild. Pertinent negatives include no numbness. He reports no foreign bodies present. The symptoms are aggravated by movement. The treatment provided mild relief.  Hyperlipidemia This is a chronic problem. The current episode started more than 1 year ago. The problem is uncontrolled. Exacerbating diseases include obesity. He is currently on no antihyperlipidemic treatment. The current treatment provides no improvement of lipids. Risk factors for coronary artery disease include dyslipidemia, male sex, hypertension and a sedentary lifestyle.  Hypertension This is a chronic problem. The current episode started more than 1 year ago. The problem has been waxing and waning since onset. Pertinent negatives include no PND. Risk factors for coronary artery disease include dyslipidemia, obesity, sedentary lifestyle and smoking/tobacco exposure.      Review of Systems  Cardiovascular: Negative for PND.  Musculoskeletal: Positive for stiffness.  Neurological: Negative for  numbness.  All other systems reviewed and are negative.  Family History  Problem Relation Age of Onset  . Cancer Father        Emphezema   Social History   Socioeconomic History  . Marital status: Divorced    Spouse name: Not on file  . Number of children: Not on file  . Years of education: Not on file  . Highest education level: Not on file  Occupational History  . Not on file  Tobacco Use  . Smoking status: Never Smoker  . Smokeless tobacco: Never Used  Vaping Use  . Vaping Use: Never used  Substance and Sexual Activity  . Alcohol use: Yes    Comment: rare  . Drug use: No  . Sexual activity: Not on file  Other Topics Concern  . Not on file  Social History Narrative  . Not on file   Social Determinants of Health   Financial Resource Strain: Not on file  Food Insecurity: Not on file  Transportation Needs: Not on file  Physical Activity: Not on file  Stress: Not on file  Social Connections: Not on file       Objective:   Physical Exam Vitals reviewed.  Constitutional:      General: He is not in acute distress.    Appearance: He is well-developed and well-nourished.  HENT:     Head: Normocephalic.     Right Ear: Tympanic membrane normal.     Left Ear: Tympanic membrane normal.     Mouth/Throat:  Mouth: Oropharynx is clear and moist.  Eyes:     General:        Right eye: No discharge.        Left eye: No discharge.     Pupils: Pupils are equal, round, and reactive to light.  Neck:     Thyroid: No thyromegaly.  Cardiovascular:     Rate and Rhythm: Normal rate and regular rhythm.     Pulses: Intact distal pulses.     Heart sounds: Normal heart sounds. No murmur heard.   Pulmonary:     Effort: Pulmonary effort is normal. No respiratory distress.     Breath sounds: Normal breath sounds. No wheezing.  Abdominal:     General: Bowel sounds are normal. There is no distension.     Palpations: Abdomen is soft.     Tenderness: There is no abdominal  tenderness.  Musculoskeletal:        General: No tenderness.     Cervical back: Normal range of motion and neck supple.     Right lower leg: Edema (trace) present.     Left lower leg: Edema (trace) present.     Comments: Pain in left shoulder with abduction   Skin:    General: Skin is warm and dry.     Findings: No erythema or rash.  Neurological:     Mental Status: He is alert and oriented to person, place, and time.     Cranial Nerves: No cranial nerve deficit.     Deep Tendon Reflexes: Reflexes are normal and symmetric.  Psychiatric:        Mood and Affect: Mood and affect normal.        Behavior: Behavior normal.        Thought Content: Thought content normal.        Judgment: Judgment normal.       BP (!) 177/109   Pulse 77   Temp 98 F (36.7 C) (Temporal)   Ht 5' 8"  (1.727 m)   Wt 214 lb 6.4 oz (97.3 kg)   BMI 32.60 kg/m      Assessment & Plan:  Ricky Garcia comes in today with chief complaint of Annual Exam, Shoulder Pain (Left shoulder. 2 mths comes and goes in right ), and Hip Pain (Bilateral hip pain comes and goes )   Diagnosis and orders addressed:  1. Annual physical exam - Cologuard - CMP14+EGFR - CBC with Differential/Platelet - Lipid panel - TSH - PSA, total and free  2. Hyperlipidemia, unspecified hyperlipidemia type - CMP14+EGFR - CBC with Differential/Platelet - Lipid panel  3. Colon cancer screening - Cologuard - CMP14+EGFR - CBC with Differential/Platelet  4. Primary osteoarthritis involving multiple joints Will start diclofenac 75 mg BID  No other NSAID's  - CMP14+EGFR - CBC with Differential/Platelet - diclofenac (VOLTAREN) 75 MG EC tablet; Take 1 tablet (75 mg total) by mouth 2 (two) times daily.  Dispense: 30 tablet; Refill: 0  5. Primary hypertension Start lisinopril 10 mg today -Daily blood pressure log given with instructions on how to fill out and told to bring to next visit -Dash diet information given -Exercise  encouraged - Stress Management  -Continue current meds -RTO in 2 week  - lisinopril (ZESTRIL) 10 MG tablet; Take 1 tablet (10 mg total) by mouth daily.  Dispense: 90 tablet; Refill: 3   Labs pending Health Maintenance reviewed Diet and exercise encouraged  Follow up plan: 2 weeks   Evelina Dun, FNP

## 2020-12-27 NOTE — Patient Instructions (Addendum)
Health Maintenance After Age 70 After age 70, you are at a higher risk for certain long-term diseases and infections as well as injuries from falls. Falls are a major cause of broken bones and head injuries in people who are older than age 70. Getting regular preventive care can help to keep you healthy and well. Preventive care includes getting regular testing and making lifestyle changes as recommended by your health care provider. Talk with your health care provider about:  Which screenings and tests you should have. A screening is a test that checks for a disease when you have no symptoms.  A diet and exercise plan that is right for you. What should I know about screenings and tests to prevent falls? Screening and testing are the best ways to find a health problem early. Early diagnosis and treatment give you the best chance of managing medical conditions that are common after age 70. Certain conditions and lifestyle choices may make you more likely to have a fall. Your health care provider may recommend:  Regular vision checks. Poor vision and conditions such as cataracts can make you more likely to have a fall. If you wear glasses, make sure to get your prescription updated if your vision changes.  Medicine review. Work with your health care provider to regularly review all of the medicines you are taking, including over-the-counter medicines. Ask your health care provider about any side effects that may make you more likely to have a fall. Tell your health care provider if any medicines that you take make you feel dizzy or sleepy.  Osteoporosis screening. Osteoporosis is a condition that causes the bones to get weaker. This can make the bones weak and cause them to break more easily.  Blood pressure screening. Blood pressure changes and medicines to control blood pressure can make you feel dizzy.  Strength and balance checks. Your health care provider may recommend certain tests to check your  strength and balance while standing, walking, or changing positions.  Foot health exam. Foot pain and numbness, as well as not wearing proper footwear, can make you more likely to have a fall.  Depression screening. You may be more likely to have a fall if you have a fear of falling, feel emotionally low, or feel unable to do activities that you used to do.  Alcohol use screening. Using too much alcohol can affect your balance and may make you more likely to have a fall. What actions can I take to lower my risk of falls? General instructions  Talk with your health care provider about your risks for falling. Tell your health care provider if: ? You fall. Be sure to tell your health care provider about all falls, even ones that seem minor. ? You feel dizzy, sleepy, or off-balance.  Take over-the-counter and prescription medicines only as told by your health care provider. These include any supplements.  Eat a healthy diet and maintain a healthy weight. A healthy diet includes low-fat dairy products, low-fat (lean) meats, and fiber from whole grains, beans, and lots of fruits and vegetables. Home safety  Remove any tripping hazards, such as rugs, cords, and clutter.  Install safety equipment such as grab bars in bathrooms and safety rails on stairs.  Keep rooms and walkways well-lit. Activity  Follow a regular exercise program to stay fit. This will help you maintain your balance. Ask your health care provider what types of exercise are appropriate for you.  If you need a cane or walker,   use it as recommended by your health care provider.  Wear supportive shoes that have nonskid soles.   Lifestyle  Do not drink alcohol if your health care provider tells you not to drink.  If you drink alcohol, limit how much you have: ? 0-1 drink a day for women. ? 0-2 drinks a day for men.  Be aware of how much alcohol is in your drink. In the [place] one drink equals one typical bottle of beer (12  oz), one-half glass of wine (5 oz), or one shot of hard liquor (1 oz).  Do not use any products that contain nicotine or tobacco, such as cigarettes and e-cigarettes. If you need help quitting, ask your health care provider. Summary  Having a healthy lifestyle and getting preventive care can help to protect your health and wellness after age 70.  Screening and testing are the best way to find a health problem early and help you avoid having a fall. Early diagnosis and treatment give you the best chance for managing medical conditions that are more common for people who are older than age 70.  Falls are a major cause of broken bones and head injuries in people who are older than age 70. Take precautions to prevent a fall at home.  Work with your health care provider to learn what changes you can make to improve your health and wellness and to prevent falls. This information is not intended to replace advice given to you by your health care provider. Make sure you discuss any questions you have with your health care provider. Document Revised: 02/12/2019 Document Reviewed: 09/04/2017 Elsevier Patient Education  2021 Else[employer] Inc. Hypertension, Adult High blood pressure (hypertension) is when the force of blood pumping through the arteries is too strong. The arteries are the blood vessels that carry blood from the heart throughout the body. Hypertension forces the heart to work harder to pump blood and may cause arteries to become narrow or stiff. Untreated or uncontrolled hypertension can cause a heart attack, heart failure, a stroke, kidney disease, and other problems. A blood pressure reading consists of a higher number over a lower number. Ideally, your blood pressure should be below 120/80. The first ("top") number is called the systolic pressure. It is a measure of the pressure in your arteries as your heart beats. The second ("bottom") number is called the diastolic pressure. It is a measure  of the pressure in your arteries as the heart relaxes. What are the causes? The exact cause of this condition is not known. There are some conditions that result in or are related to high blood pressure. What increases the risk? Some risk factors for high blood pressure are under your control. The following factors may make you more likely to develop this condition:  Smoking.  Having type 2 diabetes mellitus, high cholesterol, or both.  Not getting enough exercise or physical activity.  Being overweight.  Having too much fat, sugar, calories, or salt (sodium) in your diet.  Drinking too much alcohol. Some risk factors for high blood pressure may be difficult or impossible to change. Some of these factors include:  Having chronic kidney disease.  Having a family history of high blood pressure.  Age. Risk increases with age.  Race. You may be at higher risk if you are African American.  Gender. Men are at higher risk than women before age 45. After age 70, women are at higher risk than men.  Having obstructive sleep apnea.  Stress.   What are the signs or symptoms? High blood pressure may not cause symptoms. Very high blood pressure (hypertensive crisis) may cause:  Headache.  Anxiety.  Shortness of breath.  Nosebleed.  Nausea and vomiting.  Vision changes.  Severe chest pain.  Seizures. How is this diagnosed? This condition is diagnosed by measuring your blood pressure while you are seated, with your arm resting on a flat surface, your legs uncrossed, and your feet flat on the floor. The cuff of the blood pressure monitor will be placed directly against the skin of your upper arm at the level of your heart. It should be measured at least twice using the same arm. Certain conditions can cause a difference in blood pressure between your right and left arms. Certain factors can cause blood pressure readings to be lower or higher than normal for a short period of  time:  When your blood pressure is higher when you are in a health care provider's office than when you are at home, this is called white coat hypertension. Most people with this condition do not need medicines.  When your blood pressure is higher at home than when you are in a health care provider's office, this is called masked hypertension. Most people with this condition may need medicines to control blood pressure. If you have a high blood pressure reading during one visit or you have normal blood pressure with other risk factors, you may be asked to:  Return on a different day to have your blood pressure checked again.  Monitor your blood pressure at home for 1 week or longer. If you are diagnosed with hypertension, you may have other blood or imaging tests to help your health care provider understand your overall risk for other conditions. How is this treated? This condition is treated by making healthy lifestyle changes, such as eating healthy foods, exercising more, and reducing your alcohol intake. Your health care provider may prescribe medicine if lifestyle changes are not enough to get your blood pressure under control, and if:  Your systolic blood pressure is above 130.  Your diastolic blood pressure is above 80. Your personal target blood pressure may vary depending on your medical conditions, your age, and other factors. Follow these instructions at home: Eating and drinking  Eat a diet that is high in fiber and potassium, and low in sodium, added sugar, and fat. An example eating plan is called the DASH (Dietary Approaches to Stop Hypertension) diet. To eat this way: ? Eat plenty of fresh fruits and vegetables. Try to fill one half of your plate at each meal with fruits and vegetables. ? Eat whole grains, such as whole-wheat pasta, brown rice, or whole-grain bread. Fill about one fourth of your plate with whole grains. ? Eat or drink low-fat dairy products, such as skim milk  or low-fat yogurt. ? Avoid fatty cuts of meat, processed or cured meats, and poultry with skin. Fill about one fourth of your plate with lean proteins, such as fish, chicken without skin, beans, eggs, or tofu. ? Avoid pre-made and processed foods. These tend to be higher in sodium, added sugar, and fat.  Reduce your daily sodium intake. Most people with hypertension should eat less than 1,500 mg of sodium a day.  Do not drink alcohol if: ? Your health care provider tells you not to drink. ? You are pregnant, may be pregnant, or are planning to become pregnant.  If you drink alcohol: ? Limit how much you use to:    0-1 drink a day for women.  0-2 drinks a day for men. ? Be aware of how much alcohol is in your drink. In the [place] one drink equals one 12 oz bottle of beer (355 mL), one 5 oz glass of wine (148 mL), or one 1 oz glass of hard liquor (44 mL).   Lifestyle  Work with your health care provider to maintain a healthy body weight or to lose weight. Ask what an ideal weight is for you.  Get at least 30 minutes of exercise most days of the week. Activities may include walking, swimming, or biking.  Include exercise to strengthen your muscles (resistance exercise), such as Pilates or lifting weights, as part of your weekly exercise routine. Try to do these types of exercises for 30 minutes at least 3 days a week.  Do not use any products that contain nicotine or tobacco, such as cigarettes, e-cigarettes, and chewing tobacco. If you need help quitting, ask your health care provider.  Monitor your blood pressure at home as told by your health care provider.  Keep all follow-up visits as told by your health care provider. This is important.   Medicines  Take over-the-counter and prescription medicines only as told by your health care provider. Follow directions carefully. Blood pressure medicines must be taken as prescribed.  Do not skip doses of blood pressure medicine. Doing this  puts you at risk for problems and can make the medicine less effective.  Ask your health care provider about side effects or reactions to medicines that you should watch for. Contact a health care provider if you:  Think you are having a reaction to a medicine you are taking.  Have headaches that keep coming back (recurring).  Feel dizzy.  Have swelling in your ankles.  Have trouble with your vision. Get help right away if you:  Develop a severe headache or confusion.  Have unusual weakness or numbness.  Feel faint.  Have severe pain in your chest or abdomen.  Vomit repeatedly.  Have trouble breathing. Summary  Hypertension is when the force of blood pumping through your arteries is too strong. If this condition is not controlled, it may put you at risk for serious complications.  Your personal target blood pressure may vary depending on your medical conditions, your age, and other factors. For most people, a normal blood pressure is less than 120/80.  Hypertension is treated with lifestyle changes, medicines, or a combination of both. Lifestyle changes include losing weight, eating a healthy, low-sodium diet, exercising more, and limiting alcohol. This information is not intended to replace advice given to you by your health care provider. Make sure you discuss any questions you have with your health care provider. Document Revised: 07/02/2018 Document Reviewed: 07/02/2018 Elsevier Patient Education  2021 Elsevier Inc.  

## 2020-12-28 LAB — CBC WITH DIFFERENTIAL/PLATELET
Basophils Absolute: 0 10*3/uL (ref 0.0–0.2)
Basos: 0 %
EOS (ABSOLUTE): 0.1 10*3/uL (ref 0.0–0.4)
Eos: 1 %
Hematocrit: 44.4 % (ref 37.5–51.0)
Hemoglobin: 15 g/dL (ref 13.0–17.7)
Immature Grans (Abs): 0 10*3/uL (ref 0.0–0.1)
Immature Granulocytes: 0 %
Lymphocytes Absolute: 1.8 10*3/uL (ref 0.7–3.1)
Lymphs: 20 %
MCH: 30.7 pg (ref 26.6–33.0)
MCHC: 33.8 g/dL (ref 31.5–35.7)
MCV: 91 fL (ref 79–97)
Monocytes Absolute: 0.6 10*3/uL (ref 0.1–0.9)
Monocytes: 7 %
Neutrophils Absolute: 6.5 10*3/uL (ref 1.4–7.0)
Neutrophils: 72 %
Platelets: 375 10*3/uL (ref 150–450)
RBC: 4.89 x10E6/uL (ref 4.14–5.80)
RDW: 13 % (ref 11.6–15.4)
WBC: 9.1 10*3/uL (ref 3.4–10.8)

## 2020-12-28 LAB — LIPID PANEL
Chol/HDL Ratio: 6.4 ratio — ABNORMAL HIGH (ref 0.0–5.0)
Cholesterol, Total: 197 mg/dL (ref 100–199)
HDL: 31 mg/dL — ABNORMAL LOW (ref >39)
LDL Chol Calc (NIH): 120 mg/dL — ABNORMAL HIGH (ref 0–99)
Triglycerides: 260 mg/dL — ABNORMAL HIGH (ref 0–149)
VLDL Cholesterol Cal: 46 mg/dL — ABNORMAL HIGH (ref 5–40)

## 2020-12-28 LAB — PSA, TOTAL AND FREE
PSA, Free Pct: 13.8 %
PSA, Free: 0.18 ng/mL
Prostate Specific Ag, Serum: 1.3 ng/mL (ref 0.0–4.0)

## 2020-12-28 LAB — CMP14+EGFR
ALT: 29 IU/L (ref 0–44)
AST: 30 IU/L (ref 0–40)
Albumin/Globulin Ratio: 1.5 (ref 1.2–2.2)
Albumin: 4.5 g/dL (ref 3.8–4.8)
Alkaline Phosphatase: 118 IU/L (ref 44–121)
BUN/Creatinine Ratio: 14 (ref 10–24)
BUN: 13 mg/dL (ref 8–27)
Bilirubin Total: 0.2 mg/dL (ref 0.0–1.2)
CO2: 24 mmol/L (ref 20–29)
Calcium: 9.7 mg/dL (ref 8.6–10.2)
Chloride: 103 mmol/L (ref 96–106)
Creatinine, Ser: 0.96 mg/dL (ref 0.76–1.27)
GFR calc Af Amer: 93 mL/min/{1.73_m2} (ref >59)
GFR calc non Af Amer: 80 mL/min/{1.73_m2} (ref >59)
Globulin, Total: 3.1 g/dL (ref 1.5–4.5)
Glucose: 95 mg/dL (ref 65–99)
Potassium: 5.1 mmol/L (ref 3.5–5.2)
Sodium: 140 mmol/L (ref 134–144)
Total Protein: 7.6 g/dL (ref 6.0–8.5)

## 2020-12-28 LAB — TSH: TSH: 2.74 u[IU]/mL (ref 0.450–4.500)

## 2020-12-29 ENCOUNTER — Other Ambulatory Visit: Payer: Self-pay | Admitting: Family

## 2020-12-29 MED ORDER — ROSUVASTATIN CALCIUM 10 MG PO TABS: 10.0000 mg | ORAL_TABLET | Freq: Every day | ORAL | 3 refills | Status: DC

## 2021-01-05 LAB — COLOGUARD

## 2021-01-11 ENCOUNTER — Encounter: Payer: Self-pay | Admitting: Family

## 2021-01-11 ENCOUNTER — Other Ambulatory Visit: Payer: Self-pay

## 2021-01-11 ENCOUNTER — Ambulatory Visit (INDEPENDENT_AMBULATORY_CARE_PROVIDER_SITE_OTHER): Payer: Medicare HMO | Admitting: Family

## 2021-01-11 VITALS — BP 137/74 | HR 65 | Temp 97.5°F | Ht 68.0 in | Wt 212.6 lb

## 2021-01-11 DIAGNOSIS — M19012 Primary osteoarthritis, left shoulder: Secondary | ICD-10-CM

## 2021-01-11 DIAGNOSIS — I10 Essential (primary) hypertension: Secondary | ICD-10-CM | POA: Insufficient documentation

## 2021-01-11 DIAGNOSIS — B3749 Other urogenital candidiasis: Secondary | ICD-10-CM

## 2021-01-11 MED ORDER — NYSTATIN 100000 UNIT/GM EX POWD: 1.0000 "application " | Freq: Three times a day (TID) | CUTANEOUS | 0 refills | Status: DC

## 2021-01-11 MED ORDER — NYSTATIN 100000 UNIT/GM EX CREA: 1.0000 "application " | TOPICAL_CREAM | Freq: Two times a day (BID) | CUTANEOUS | 0 refills | Status: DC

## 2021-01-11 NOTE — Progress Notes (Signed)
Subjective:    Patient ID: Ricky Garcia, male    DOB: [birth year], 70 y.o.   MRN: [id number]  Chief Complaint  Patient presents with  . Hypertension   Pt presents to the office today to recheck HTN. He was seen on 12/27/20 and started on Lisinopril 10 mg. His BP is at goal today!  He is also complaining of yeast infection of the head of his penis over the last week or so. He states he has been using OTC creams that have helped. Reports the area is red and swollen.  Hypertension This is a chronic problem. The current episode started more than 1 year ago. The problem has been resolved since onset. The problem is controlled. Pertinent negatives include no malaise/fatigue, peripheral edema or shortness of breath. Past treatments include ACE inhibitors. The current treatment provides moderate improvement.  Arthritis Presents for follow-up visit. He complains of pain and stiffness. The symptoms have been improving. Affected locations include the left shoulder. His pain is at a severity of 3/10.      Review of Systems  Constitutional: Negative for malaise/fatigue.  Respiratory: Negative for shortness of breath.   Musculoskeletal: Positive for arthritis and stiffness.  All other systems reviewed and are negative.      Objective:   Physical Exam Vitals reviewed.  Constitutional:      General: He is not in acute distress.    Appearance: He is well-developed and well-nourished.  HENT:     Head: Normocephalic.     Right Ear: Tympanic membrane normal.     Left Ear: Tympanic membrane normal.     Mouth/Throat:     Mouth: Oropharynx is clear and moist.  Eyes:     General:        Right eye: No discharge.        Left eye: No discharge.     Pupils: Pupils are equal, round, and reactive to light.  Neck:     Thyroid: No thyromegaly.  Cardiovascular:     Rate and Rhythm: Normal rate and regular rhythm.     Pulses: Intact distal pulses.     Heart sounds: Normal heart sounds. No murmur  heard.   Pulmonary:     Effort: Pulmonary effort is normal. No respiratory distress.     Breath sounds: Normal breath sounds. No wheezing.  Abdominal:     General: Bowel sounds are normal. There is no distension.     Palpations: Abdomen is soft.     Tenderness: There is no abdominal tenderness.  Genitourinary:    Comments: Head of penis mildly erythemas, white discharge Musculoskeletal:        General: No tenderness or edema.     Cervical back: Normal range of motion and neck supple.     Comments: Pain in left shoulder with abduction  Skin:    General: Skin is warm and dry.     Findings: No erythema or rash.  Neurological:     Mental Status: He is alert and oriented to person, place, and time.     Cranial Nerves: No cranial nerve deficit.     Deep Tendon Reflexes: Reflexes are normal and symmetric.  Psychiatric:        Mood and Affect: Mood and affect normal.        Behavior: Behavior normal.        Thought Content: Thought content normal.        Judgment: Judgment normal.       BP  137/74   Pulse 65   Temp (!) 97.5 F (36.4 C) (Temporal)   Ht 5' 8"  (1.727 m)   Wt 212 lb 9.6 oz (96.4 kg)   BMI 32.33 kg/m      Assessment & Plan:  [name] comes in today with chief complaint of Hypertension   Diagnosis and orders addressed:  1. Osteoarthritis of left shoulder, unspecified osteoarthritis type Continue Diclofenac 75 mg BID Continue ROM exercises - BMP8+EGFR  2. Yeast dermatitis of penis Keep clean and dry Clean after urinating  Use nystatin powder and cream - BMP8+EGFR - nystatin (MYCOSTATIN/NYSTOP) powder; Apply 1 application topically 3 (three) times daily.  Dispense: 15 g; Refill: 0 - nystatin cream (MYCOSTATIN); Apply 1 application topically 2 (two) times daily.  Dispense: 30 g; Refill: 0  3. Primary hypertension At goal today Continue lisinopril  - BMP8+EGFR   Labs pending Health Maintenance reviewed Diet and exercise encouraged  Follow  up plan: 6 moths    Evelina Dun, FNP

## 2021-01-11 NOTE — Patient Instructions (Signed)

## 2021-01-12 LAB — BMP8+EGFR
BUN/Creatinine Ratio: 11 (ref 10–24)
BUN: 11 mg/dL (ref 8–27)
CO2: 22 mmol/L (ref 20–29)
Calcium: 9.5 mg/dL (ref 8.6–10.2)
Chloride: 103 mmol/L (ref 96–106)
Creatinine, Ser: 0.98 mg/dL (ref 0.76–1.27)
Glucose: 87 mg/dL (ref 65–99)
Potassium: 4.8 mmol/L (ref 3.5–5.2)
Sodium: 142 mmol/L (ref 134–144)
eGFR: 83 mL/min/{1.73_m2} (ref >59)

## 2021-03-03 ENCOUNTER — Other Ambulatory Visit: Payer: Self-pay

## 2021-03-03 ENCOUNTER — Encounter: Payer: Self-pay | Admitting: Family Medicine

## 2021-03-03 ENCOUNTER — Ambulatory Visit (INDEPENDENT_AMBULATORY_CARE_PROVIDER_SITE_OTHER): Payer: Medicare HMO | Admitting: Family Medicine

## 2021-03-03 VITALS — BP 139/72 | HR 75 | Temp 97.5°F | Ht 68.0 in | Wt 212.1 lb

## 2021-03-03 DIAGNOSIS — L0390 Cellulitis, unspecified: Secondary | ICD-10-CM | POA: Diagnosis not present

## 2021-03-03 MED ORDER — DOXYCYCLINE HYCLATE 100 MG PO TABS: 100.0000 mg | ORAL_TABLET | Freq: Two times a day (BID) | ORAL | 0 refills | Status: AC

## 2021-03-03 NOTE — Patient Instructions (Signed)

## 2021-03-03 NOTE — Progress Notes (Signed)
Acute Office Visit  Subjective:    Patient ID: Ricky Garcia, male    DOB: 1951/08/25, 70 y.o.   MRN: [id number]  Chief Complaint  Patient presents with  . Open Wound    HPI Patient is in today for a wound on his right lower leg, under his knee. The wound started after bumping his knee on a tote at work 3 days ago. The wound has started to drain purulent drainage. The area around the wound has started to become red, tender, and warm. He denies decreased ROM of his knee or pain with movement. Denies fever, chills, nausea, or vomiting.   History reviewed. No pertinent past medical history.  History reviewed. No pertinent surgical history.  Family History  Problem Relation Age of Onset  . Cancer Father        Emphezema    Social History   Socioeconomic History  . Marital status: Divorced    Spouse name: Not on file  . Number of children: Not on file  . Years of education: Not on file  . Highest education level: Not on file  Occupational History  . Not on file  Tobacco Use  . Smoking status: Never Smoker  . Smokeless tobacco: Never Used  Vaping Use  . Vaping Use: Never used  Substance and Sexual Activity  . Alcohol use: Yes    Comment: rare  . Drug use: No  . Sexual activity: Not on file  Other Topics Concern  . Not on file  Social History Narrative  . Not on file   Social Determinants of Health   Financial Resource Strain: Not on file  Food Insecurity: Not on file  Transportation Needs: Not on file  Physical Activity: Not on file  Stress: Not on file  Social Connections: Not on file  Intimate Partner Violence: Not on file    Outpatient Medications Prior to Visit  Medication Sig Dispense Refill  . diclofenac (VOLTAREN) 75 MG EC tablet Take 1 tablet (75 mg total) by mouth 2 (two) times daily. 30 tablet 0  . lisinopril (ZESTRIL) 10 MG tablet Take 1 tablet (10 mg total) by mouth daily. 90 tablet 3  . nystatin (MYCOSTATIN/NYSTOP) powder Apply 1 application  topically 3 (three) times daily. 15 g 0  . nystatin cream (MYCOSTATIN) Apply 1 application topically 2 (two) times daily. 30 g 0  . rosuvastatin (CRESTOR) 10 MG tablet Take 1 tablet (10 mg total) by mouth daily. 90 tablet 3   No facility-administered medications prior to visit.    Allergies  Allergen Reactions  . Penicillins     Review of Systems As per HPI.     Objective:    Physical Exam Vitals and nursing note reviewed.  Constitutional:      General: He is not in acute distress.    Appearance: He is not ill-appearing, toxic-appearing or diaphoretic.  Pulmonary:     Effort: Pulmonary effort is normal. No respiratory distress.  Skin:    Findings: Wound present.     Comments: Wound to right lower leg, just under tibial tuberosity. Wound is about 1.5 cm wide x 0.5 cm with purulent drainage. No areas of induration or flucuation. Surround erythema that extends above and below with mild swelling and tenderness. Warmth noted. Full ROM on knee. Sensation intact.   Neurological:     General: No focal deficit present.     Mental Status: He is alert.  Psychiatric:        Mood and  Affect: Mood normal.        Behavior: Behavior normal.     BP 139/72   Pulse 75   Temp (!) 97.5 F (36.4 C) (Temporal)   Ht 5' 8"  (1.727 m)   Wt 212 lb 2 oz (96.2 kg)   BMI 32.25 kg/m  Wt Readings from Last 3 Encounters:  03/03/21 212 lb 2 oz (96.2 kg)  01/11/21 212 lb 9.6 oz (96.4 kg)  12/27/20 214 lb 6.4 oz (97.3 kg)    Health Maintenance Due  Topic Date Due  . Fecal DNA (Cologuard)  Never done  . COVID-19 Vaccine (3 - Booster for Pfizer series) 01/25/2021    There are no preventive care reminders to display for this patient.   Lab Results  Component Value Date   TSH 2.740 12/27/2020   Lab Results  Component Value Date   WBC 9.1 12/27/2020   HGB 15.0 12/27/2020   HCT 44.4 12/27/2020   MCV 91 12/27/2020   PLT 375 12/27/2020   Lab Results  Component Value Date   NA 142  01/11/2021   K 4.8 01/11/2021   CO2 22 01/11/2021   GLUCOSE 87 01/11/2021   BUN 11 01/11/2021   CREATININE 0.98 01/11/2021   BILITOT 0.2 12/27/2020   ALKPHOS 118 12/27/2020   AST 30 12/27/2020   ALT 29 12/27/2020   PROT 7.6 12/27/2020   ALBUMIN 4.5 12/27/2020   CALCIUM 9.5 01/11/2021   EGFR 83 01/11/2021   Lab Results  Component Value Date   CHOL 197 12/27/2020   Lab Results  Component Value Date   HDL 31 (L) 12/27/2020   Lab Results  Component Value Date   LDLCALC 120 (H) 12/27/2020   Lab Results  Component Value Date   TRIG 260 (H) 12/27/2020   Lab Results  Component Value Date   CHOLHDL 6.4 (H) 12/27/2020   Lab Results  Component Value Date   HGBA1C 5.4 03/20/2018       Assessment & Plan:   Jrake was seen today for open wound.  Diagnoses and all orders for this visit:  Wound cellulitis Doxycyline as below. Keep wound clean and dry. Follow up in office on Monday for wound check. Instructed patient to call access line or go to urgent care over the weekend if symptoms worsen. Handout given.  -     doxycycline (VIBRA-TABS) 100 MG tablet; Take 1 tablet (100 mg total) by mouth 2 (two) times daily for 10 days. 1 po bid  The above assessment and management plan was discussed with the patient. The patient verbalized understanding of and has agreed to the management plan. Patient is aware to call the clinic if they develop any new symptoms or if symptoms fail to improve or worsen. Patient is aware when to return to the clinic for a follow-up visit. Patient educated on when it is appropriate to go to the emergency department.    Gwenlyn Perking, FNP

## 2021-03-06 ENCOUNTER — Ambulatory Visit: Payer: Medicare HMO | Admitting: Family Medicine

## 2021-03-07 ENCOUNTER — Encounter: Payer: Self-pay | Admitting: Family

## 2021-03-14 ENCOUNTER — Encounter: Payer: Self-pay | Admitting: Family Medicine

## 2021-06-02 ENCOUNTER — Encounter: Payer: Self-pay | Admitting: Family Medicine

## 2021-09-14 ENCOUNTER — Telehealth: Payer: Self-pay | Admitting: Family

## 2021-09-14 NOTE — Telephone Encounter (Signed)
Left message for patient to call back and schedule Medicare Annual Wellness Visit (AWV) either virtually or phone I left my number for patient to call [phone].  due 03/06/2019 awvi per [place] please schedule at anytime with health coach  This should be a 45 minute visit.

## 2021-11-15 ENCOUNTER — Ambulatory Visit (INDEPENDENT_AMBULATORY_CARE_PROVIDER_SITE_OTHER): Payer: Medicare HMO | Admitting: Nurse Practitioner

## 2021-11-15 ENCOUNTER — Encounter: Payer: Self-pay | Admitting: Nurse Practitioner

## 2021-11-15 VITALS — BP 199/101 | HR 70 | Temp 97.7°F | Resp 20 | Ht 68.0 in | Wt 217.0 lb

## 2021-11-15 DIAGNOSIS — L03211 Cellulitis of face: Secondary | ICD-10-CM

## 2021-11-15 DIAGNOSIS — L0201 Cutaneous abscess of face: Secondary | ICD-10-CM

## 2021-11-15 MED ORDER — CEFTRIAXONE SODIUM 1 G IJ SOLR
1.0000 g | Freq: Once | INTRAMUSCULAR | Status: AC
  Administered 2021-11-15: 1 g via INTRAMUSCULAR

## 2021-11-15 MED ORDER — DOXYCYCLINE HYCLATE 100 MG PO TABS: 100.0000 mg | ORAL_TABLET | Freq: Two times a day (BID) | ORAL | 0 refills | Status: DC

## 2021-11-15 NOTE — Patient Instructions (Signed)
Skin Abscess ?A skin abscess is an infected area of your skin that contains pus and other material. An abscess can happen in any part of your body. Some abscesses break open (rupture) on their own. Most continue to get worse unless they are treated. The infection can spread deeper into the body and into your blood, which can make you feel sick. ?A skin abscess is caused by germs that enter the skin through a cut or scrape. It can also be caused by blocked oil and sweat glands or infected hair follicles. ?This condition is usually treated by: ?Draining the pus. ?Taking antibiotic medicines. ?Placing a warm, wet washcloth over the abscess. ?Follow these instructions at home: ?Medicines ? ?Take over-the-counter and prescription medicines only as told by your doctor. ?If you were prescribed an antibiotic medicine, take it as told by your doctor. Do not stop taking the antibiotic even if you start to feel better. ?Abscess care ? ?If you have an abscess that has not drained, place a warm, clean, wet washcloth over the abscess several times a day. Do this as told by your doctor. ?Follow instructions from your doctor about how to take care of your abscess. Make sure you: ?Cover the abscess with a bandage (dressing). ?Change your bandage or gauze as told by your doctor. ?Wash your hands with soap and water before you change the bandage or gauze. If you cannot use soap and water, use hand sanitizer. ?Check your abscess every day for signs that the infection is getting worse. Check for: ?More redness, swelling, or pain. ?More fluid or blood. ?Warmth. ?More pus or a bad smell. ?General instructions ?To avoid spreading the infection: ?Do not share personal care items, towels, or hot tubs with others. ?Avoid making skin-to-skin contact with other people. ?Keep all follow-up visits as told by your doctor. This is important. ?Contact a doctor if: ?You have more redness, swelling, or pain around your abscess. ?You have more fluid or  blood coming from your abscess. ?Your abscess feels warm when you touch it. ?You have more pus or a bad smell coming from your abscess. ?You have a fever. ?Your muscles ache. ?You have chills. ?You feel sick. ?Get help right away if: ?You have very bad (severe) pain. ?You see red streaks on your skin spreading away from the abscess. ?Summary ?A skin abscess is an infected area of your skin that contains pus and other material. ?The abscess is caused by germs that enter the skin through a cut or scrape. It can also be caused by blocked oil and sweat glands or infected hair follicles. ?Follow your doctor's instructions on caring for your abscess, taking medicines, preventing infections, and keeping follow-up visits. ?This information is not intended to replace advice given to you by your health care provider. Make sure you discuss any questions you have with your health care provider. ?Document Revised: 05/28/2019 Document Reviewed: 12/05/2017 ?Elsevier Patient Education ? 2022 [employer] ? ?

## 2021-11-15 NOTE — Telephone Encounter (Signed)
Will close encounter

## 2021-11-15 NOTE — Progress Notes (Signed)
Acute Office Visit  Subjective:    Patient ID: Ricky Garcia, male    DOB: 07-06-1951, 71 y.o.   MRN: [id number]  Chief Complaint  Patient presents with   Abcess on face    HPI  Patient is in today for abscesses of skin on right cheek.  Symptom Present 5 days ago, patient is not sure if he was bitten by an insect. Skin is red and painful puss filled. No fever or nausea associated with current symptoms.           History reviewed. No pertinent past medical history.  History reviewed. No pertinent surgical history.  Family History  Problem Relation Age of Onset   Cancer Father        Emphezema    Social History   Socioeconomic History   Marital status: Divorced    Spouse name: Not on file   Number of children: Not on file   Years of education: Not on file   Highest education level: Not on file  Occupational History   Not on file  Tobacco Use   Smoking status: Never   Smokeless tobacco: Never  Vaping Use   Vaping Use: Never used  Substance and Sexual Activity   Alcohol use: Yes    Comment: rare   Drug use: No   Sexual activity: Not on file  Other Topics Concern   Not on file  Social History Narrative   Not on file   Social Determinants of Health   Financial Resource Strain: Not on file  Food Insecurity: Not on file  Transportation Needs: Not on file  Physical Activity: Not on file  Stress: Not on file  Social Connections: Not on file  Intimate Partner Violence: Not on file    Outpatient Medications Prior to Visit  Medication Sig Dispense Refill   diclofenac (VOLTAREN) 75 MG EC tablet Take 1 tablet (75 mg total) by mouth 2 (two) times daily. 30 tablet 0   lisinopril (ZESTRIL) 10 MG tablet Take 1 tablet (10 mg total) by mouth daily. 90 tablet 3   rosuvastatin (CRESTOR) 10 MG tablet Take 1 tablet (10 mg total) by mouth daily. 90 tablet 3   nystatin (MYCOSTATIN/NYSTOP) powder Apply 1 application topically 3 (three) times daily. (Patient not  taking: Reported on 11/15/2021) 15 g 0   nystatin cream (MYCOSTATIN) Apply 1 application topically 2 (two) times daily. 30 g 0   No facility-administered medications prior to visit.    Allergies  Allergen Reactions   Penicillins     Review of Systems  Constitutional: Negative.   HENT: Negative.    Eyes: Negative.   Respiratory: Negative.    Cardiovascular: Negative.   Gastrointestinal: Negative.   Skin:  Positive for color change.  All other systems reviewed and are negative.     Objective:    Physical Exam Vitals and nursing note reviewed.  HENT:     Head: Normocephalic.      Comments: Purulent abscess      Nose: Nose normal.  Eyes:     Conjunctiva/sclera: Conjunctivae normal.  Pulmonary:     Effort: Pulmonary effort is normal.     Breath sounds: Normal breath sounds.  Abdominal:     General: Bowel sounds are normal.  Skin:    General: Skin is warm.     Findings: Abscess present.  Neurological:     Mental Status: He is alert and oriented to person, place, and time.    BP Marland Kitchen)  199/101    Pulse 70    Temp 97.7 F (36.5 C) (Oral)    Resp 20    Ht _0  (1.727 m)    Wt 217 lb (98.4 kg)    SpO2 98%    BMI 32.99 kg/m  Wt Readings from Last 3 Encounters:  11/15/21 217 lb (98.4 kg)  03/03/21 212 lb 2 oz (96.2 kg)  01/11/21 212 lb 9.6 oz (96.4 kg)    Health Maintenance Due  Topic Date Due   Fecal DNA (Cologuard)  Never done   Zoster Vaccines- Shingrix (1 of 2) Never done   COVID-19 Vaccine (5 - Booster for Moderna series) 09/22/2020    There are no preventive care reminders to display for this patient.   Lab Results  Component Value Date   TSH 2.740 12/27/2020   Lab Results  Component Value Date   WBC 9.1 12/27/2020   HGB 15.0 12/27/2020   HCT 44.4 12/27/2020   MCV 91 12/27/2020   PLT 375 12/27/2020   Lab Results  Component Value Date   NA 142 01/11/2021   K 4.8 01/11/2021   CO2 22 01/11/2021   GLUCOSE 87 01/11/2021   BUN 11 01/11/2021    CREATININE 0.98 01/11/2021   BILITOT 0.2 12/27/2020   ALKPHOS 118 12/27/2020   AST 30 12/27/2020   ALT 29 12/27/2020   PROT 7.6 12/27/2020   ALBUMIN 4.5 12/27/2020   CALCIUM 9.5 01/11/2021   EGFR 83 01/11/2021   Lab Results  Component Value Date   CHOL 197 12/27/2020   Lab Results  Component Value Date   HDL 31 (L) 12/27/2020   Lab Results  Component Value Date   LDLCALC 120 (H) 12/27/2020   Lab Results  Component Value Date   TRIG 260 (H) 12/27/2020   Lab Results  Component Value Date   CHOLHDL 6.4 (H) 12/27/2020   Lab Results  Component Value Date   HGBA1C 5.4 03/20/2018       Assessment & Plan:  Hard Abscess cleaned and triple antibiotic applied, 1 gram of Rocephin given in clinic, Doxycycline 100 mg tablet by mouth twice daily. Follow up in 3 days.   Problem List Items Addressed This Visit   None Visit Diagnoses     Cutaneous abscess of face    -  Primary   Relevant Medications   doxycycline (VIBRA-TABS) 100 MG tablet   Cellulitis of face         1 gram Rocephin given for cellulites of face.   Meds ordered this encounter  Medications   doxycycline (VIBRA-TABS) 100 MG tablet    Sig: Take 1 tablet (100 mg total) by mouth 2 (two) times daily.    Dispense:  14 tablet    Refill:  0    Order Specific Question:   Supervising Provider    Answer:   Claretta Fraise [[id number]]     Ivy Lynn, NP

## 2021-11-15 NOTE — Addendum Note (Signed)
Addended by: Michaela Corner on: 11/15/2021 02:16 PM   Modules accepted: Orders

## 2021-11-20 ENCOUNTER — Ambulatory Visit: Payer: Medicare HMO

## 2021-11-20 ENCOUNTER — Encounter: Payer: Self-pay | Admitting: Family

## 2021-11-20 ENCOUNTER — Ambulatory Visit (INDEPENDENT_AMBULATORY_CARE_PROVIDER_SITE_OTHER): Payer: Medicare HMO | Admitting: Family

## 2021-11-20 VITALS — BP 131/72 | HR 71 | Temp 97.3°F | Ht 68.0 in | Wt 213.0 lb

## 2021-11-20 DIAGNOSIS — L0201 Cutaneous abscess of face: Secondary | ICD-10-CM | POA: Diagnosis not present

## 2021-11-20 DIAGNOSIS — Z1211 Encounter for screening for malignant neoplasm of colon: Secondary | ICD-10-CM

## 2021-11-20 DIAGNOSIS — L03211 Cellulitis of face: Secondary | ICD-10-CM

## 2021-11-20 NOTE — Patient Instructions (Signed)
Skin Abscess  A skin abscess is an infected area on or under your skin that contains a collection of pus and other material. An abscess may also be called a furuncle,carbuncle, or boil. An abscess can occur in or on almost any part of your body. Some abscesses break open (rupture) on their own. Most continue to get worse unless they are treated. The infection can spread deeper into the body and eventually into your blood, whichcan make you feel ill. Treatment usually involves draining the abscess. What are the causes? An abscess occurs when germs, like bacteria, pass through your skin and cause an infection. This may be caused by: A scrape or cut on your skin. A puncture wound through your skin, including a needle injection or insect bite. Blocked oil or sweat glands. Blocked and infected hair follicles. A cyst that forms beneath your skin (sebaceous cyst) and becomes infected. What increases the risk? This condition is more likely to develop in people who: Have a weak body defense system (immune system). Have diabetes. Have dry and irritated skin. Get frequent injections or use illegal IV drugs. Have a foreign body in a wound, such as a splinter. Have problems with their lymph system or veins. What are the signs or symptoms? Symptoms of this condition include: A painful, firm bump under the skin. A bump with pus at the top. This may break through the skin and drain. Other symptoms include: Redness surrounding the abscess site. Warmth. Swelling of the lymph nodes (glands) near the abscess. Tenderness. A sore on the skin. How is this diagnosed? This condition may be diagnosed based on: A physical exam. Your medical history. A sample of pus. This may be used to find out what is causing the infection. Blood tests. Imaging tests, such as an ultrasound, CT scan, or MRI. How is this treated? A small abscess that drains on its own may not need treatment. Treatment for larger abscesses  may include: Moist heat or heat pack applied to the area several times a day. A procedure to drain the abscess (incision and drainage). Antibiotic medicines. For a severe abscess, you may first get antibiotics through an IV and then change to antibiotics by mouth. Follow these instructions at home: Medicines  Take over-the-counter and prescription medicines only as told by your health care provider. If you were prescribed an antibiotic medicine, take it as told by your health care provider. Do not stop taking the antibiotic even if you start to feel better.  Abscess care  If you have an abscess that has not drained, apply heat to the affected area. Use the heat source that your health care provider recommends, such as a moist heat pack or a heating pad. Place a towel between your skin and the heat source. Leave the heat on for 20-30 minutes. Remove the heat if your skin turns bright red. This is especially important if you are unable to feel pain, heat, or cold. You may have a greater risk of getting burned. Follow instructions from your health care provider about how to take care of your abscess. Make sure you: Cover the abscess with a bandage (dressing). Change your dressing or gauze as told by your health care provider. Wash your hands with soap and water before you change the dressing or gauze. If soap and water are not available, use hand sanitizer. Check your abscess every day for signs of a worsening infection. Check for: More redness, swelling, or pain. More fluid or blood. Warmth. More   pus or a bad smell.  General instructions To avoid spreading the infection: Do not share personal care items, towels, or hot tubs with others. Avoid making skin contact with other people. Keep all follow-up visits as told by your health care provider. This is important. Contact a health care provider if you have: More redness, swelling, or pain around your abscess. More fluid or blood coming  from your abscess. Warm skin around your abscess. More pus or a bad smell coming from your abscess. A fever. Muscle aches. Chills or a general ill feeling. Get help right away if you: Have severe pain. See red streaks on your skin spreading away from the abscess. Summary A skin abscess is an infected area on or under your skin that contains a collection of pus and other material. A small abscess that drains on its own may not need treatment. Treatment for larger abscesses may include having a procedure to drain the abscess and taking an antibiotic. This information is not intended to replace advice given to you by your health care provider. Make sure you discuss any questions you have with your healthcare provider. Document Revised: 02/12/2019 Document Reviewed: 12/05/2017 Elsevier Patient Education  2022 Elsevier Inc.  

## 2021-11-20 NOTE — Progress Notes (Signed)
° °  Subjective:    Patient ID: Ricky Garcia, male    DOB: [birth year], 71 y.o.   MRN: [id number]  Chief Complaint  Patient presents with   Wound Check   PT presents to the office today to recheck abscess on on right cheek. He was seen on 11/15/21 and was given Rocephin 1 g and doxycyline. He reports he was massaging the area and had a thick white discharge. He reports the swelling, redness, and heat are much better. Denies any fever.  Wound Check He was originally treated 3 to 5 days ago. Previous treatment included oral antibiotics. The redness has improved. The swelling has improved. There is no pain present.     Review of Systems  All other systems reviewed and are negative.     Objective:   Physical Exam Vitals reviewed.  Constitutional:      General: He is not in acute distress.    Appearance: He is well-developed. He is obese.  HENT:     Head: Normocephalic.     Left Ear: There is no impacted cerumen.  Eyes:     General:        Right eye: No discharge.        Left eye: No discharge.     Pupils: Pupils are equal, round, and reactive to light.  Neck:     Thyroid: No thyromegaly.  Cardiovascular:     Rate and Rhythm: Normal rate and regular rhythm.     Heart sounds: Normal heart sounds. No murmur heard. Pulmonary:     Effort: Pulmonary effort is normal. No respiratory distress.     Breath sounds: Normal breath sounds. No wheezing.  Abdominal:     General: Bowel sounds are normal. There is no distension.     Palpations: Abdomen is soft.     Tenderness: There is no abdominal tenderness.  Musculoskeletal:        General: No tenderness. Normal range of motion.     Cervical back: Normal range of motion and neck supple.  Skin:    General: Skin is warm and dry.     Findings: Abscess present. No erythema or rash.          Comments: Healing abscess on right cheek, scabbed area  Neurological:     Mental Status: He is alert and oriented to person, place, and time.      Cranial Nerves: No cranial nerve deficit.     Deep Tendon Reflexes: Reflexes are normal and symmetric.  Psychiatric:        Behavior: Behavior normal.        Thought Content: Thought content normal.        Judgment: Judgment normal.      BP 131/72    Pulse 71    Temp (!) 97.3 F (36.3 C) (Temporal)    Ht 5\' 8"  (1.727 m)    Wt 213 lb (96.6 kg)    BMI 32.39 kg/m      Assessment & Plan:  Ricky Garcia comes in today with chief complaint of Wound Check   Diagnosis and orders addressed:  1. Abscess of face  2. Cellulitis of face  3. Colon cancer screening - Cologuard   Improving, continue doxycycline  Report any fevers, increased pain, discharge, or redness  Evelina Dun, FNP

## 2021-11-21 ENCOUNTER — Ambulatory Visit: Payer: Medicare HMO

## 2021-11-29 ENCOUNTER — Ambulatory Visit (INDEPENDENT_AMBULATORY_CARE_PROVIDER_SITE_OTHER): Payer: Medicare HMO

## 2021-11-29 VITALS — Ht 68.5 in | Wt 213.0 lb

## 2021-11-29 DIAGNOSIS — Z0000 Encounter for general adult medical examination without abnormal findings: Secondary | ICD-10-CM | POA: Diagnosis not present

## 2021-11-29 DIAGNOSIS — Z1212 Encounter for screening for malignant neoplasm of rectum: Secondary | ICD-10-CM

## 2021-11-29 DIAGNOSIS — Z1211 Encounter for screening for malignant neoplasm of colon: Secondary | ICD-10-CM

## 2021-11-29 NOTE — Patient Instructions (Signed)
Mr. Ricky Garcia , Thank you for taking time to come for your Medicare Wellness Visit. I appreciate your ongoing commitment to your health goals. Please review the following plan we discussed and let me know if I can assist you in the future.   Screening recommendations/referrals: Colonoscopy: Cologuard order placed today, 11/29/2021.  Recommended yearly ophthalmology/optometry visit for glaucoma screening and checkup Recommended yearly dental visit for hygiene and checkup  Vaccinations: Influenza vaccine: Done 12/27/2020 Pneumococcal vaccine: Done 04/20/2014, 03/20/2018 and 12/27/2020. Tdap vaccine: Done 03/20/2018 Repeat in 10 years  Shingles vaccine: Shingrix discussed. Please contact your pharmacy for coverage information.     Covid-19: Done 03/21/2020, 04/18/2020, 06/30/2020 and 07/28/2020  Advanced directives: Advance directive discussed with you today. Even though you declined this today, please call our office should you change your mind, and we can give you the proper paperwork for you to fill out.   Conditions/risks identified: Aim for 30 minutes of exercise or brisk walking each day, drink 6-8 glasses of water and eat lots of fruits and vegetables. KEEP UP THE GOOD WORK!!  Next appointment: Follow up in one year for your annual wellness visit. 2024.  Preventive Care 34 Years and Older, Male  Preventive care refers to lifestyle choices and visits with your health care provider that can promote health and wellness. What does preventive care include? A yearly physical exam. This is also called an annual well check. Dental exams once or twice a year. Routine eye exams. Ask your health care provider how often you should have your eyes checked. Personal lifestyle choices, including: Daily care of your teeth and gums. Regular physical activity. Eating a healthy diet. Avoiding tobacco and drug use. Limiting alcohol use. Practicing safe sex. Taking low doses of aspirin every day. Taking  vitamin and mineral supplements as recommended by your health care provider. What happens during an annual well check? The services and screenings done by your health care provider during your annual well check will depend on your age, overall health, lifestyle risk factors, and family history of disease. Counseling  Your health care provider may ask you questions about your: Alcohol use. Tobacco use. Drug use. Emotional well-being. Home and relationship well-being. Sexual activity. Eating habits. History of falls. Memory and ability to understand (cognition). Work and work Astronomer. Screening  You may have the following tests or measurements: Height, weight, and BMI. Blood pressure. Lipid and cholesterol levels. These may be checked every 5 years, or more frequently if you are over 69 years old. Skin check. Lung cancer screening. You may have this screening every year starting at age 54 if you have a 30-pack-year history of smoking and currently smoke or have quit within the past 15 years. Fecal occult blood test (FOBT) of the stool. You may have this test every year starting at age 37. Flexible sigmoidoscopy or colonoscopy. You may have a sigmoidoscopy every 5 years or a colonoscopy every 10 years starting at age 62. Prostate cancer screening. Recommendations will vary depending on your family history and other risks. Hepatitis C blood test. Hepatitis B blood test. Sexually transmitted disease (STD) testing. Diabetes screening. This is done by checking your blood sugar (glucose) after you have not eaten for a while (fasting). You may have this done every 1-3 years. Abdominal aortic aneurysm (AAA) screening. You may need this if you are a current or former smoker. Osteoporosis. You may be screened starting at age 86 if you are at high risk. Talk with your health care provider about  your test results, treatment options, and if necessary, the need for more tests. Vaccines  Your  health care provider may recommend certain vaccines, such as: Influenza vaccine. This is recommended every year. Tetanus, diphtheria, and acellular pertussis (Tdap, Td) vaccine. You may need a Td booster every 10 years. Zoster vaccine. You may need this after age 89. Pneumococcal 13-valent conjugate (PCV13) vaccine. One dose is recommended after age 57. Pneumococcal polysaccharide (PPSV23) vaccine. One dose is recommended after age 23. Talk to your health care provider about which screenings and vaccines you need and how often you need them. This information is not intended to replace advice given to you by your health care provider. Make sure you discuss any questions you have with your health care provider. Document Released: 11/18/2015 Document Revised: 07/11/2016 Document Reviewed: 08/23/2015 Elsevier Interactive Patient Education  2017 [place] Prevention in the Home Falls can cause injuries. They can happen to people of all ages. There are many things you can do to make your home safe and to help prevent falls. What can I do on the outside of my home? Regularly fix the edges of walkways and driveways and fix any cracks. Remove anything that might make you trip as you walk through a door, such as a raised step or threshold. Trim any bushes or trees on the path to your home. Use bright outdoor lighting. Clear any walking paths of anything that might make someone trip, such as rocks or tools. Regularly check to see if handrails are loose or broken. Make sure that both sides of any steps have handrails. Any raised decks and porches should have guardrails on the edges. Have any leaves, snow, or ice cleared regularly. Use sand or salt on walking paths during winter. Clean up any spills in your garage right away. This includes oil or grease spills. What can I do in the bathroom? Use night lights. Install grab bars by the toilet and in the tub and shower. Do not use towel bars as  grab bars. Use non-skid mats or decals in the tub or shower. If you need to sit down in the shower, use a plastic, non-slip stool. Keep the floor dry. Clean up any water that spills on the floor as soon as it happens. Remove soap buildup in the tub or shower regularly. Attach bath mats securely with double-sided non-slip rug tape. Do not have throw rugs and other things on the floor that can make you trip. What can I do in the bedroom? Use night lights. Make sure that you have a light by your bed that is easy to reach. Do not use any sheets or blankets that are too big for your bed. They should not hang down onto the floor. Have a firm chair that has side arms. You can use this for support while you get dressed. Do not have throw rugs and other things on the floor that can make you trip. What can I do in the kitchen? Clean up any spills right away. Avoid walking on wet floors. Keep items that you use a lot in easy-to-reach places. If you need to reach something above you, use a strong step stool that has a grab bar. Keep electrical cords out of the way. Do not use floor polish or wax that makes floors slippery. If you must use wax, use non-skid floor wax. Do not have throw rugs and other things on the floor that can make you trip. What can I do with  my stairs? Do not leave any items on the stairs. Make sure that there are handrails on both sides of the stairs and use them. Fix handrails that are broken or loose. Make sure that handrails are as long as the stairways. Check any carpeting to make sure that it is firmly attached to the stairs. Fix any carpet that is loose or worn. Avoid having throw rugs at the top or bottom of the stairs. If you do have throw rugs, attach them to the floor with carpet tape. Make sure that you have a light switch at the top of the stairs and the bottom of the stairs. If you do not have them, ask someone to add them for you. What else can I do to help prevent  falls? Wear shoes that: Do not have high heels. Have rubber bottoms. Are comfortable and fit you well. Are closed at the toe. Do not wear sandals. If you use a stepladder: Make sure that it is fully opened. Do not climb a closed stepladder. Make sure that both sides of the stepladder are locked into place. Ask someone to hold it for you, if possible. Clearly mark and make sure that you can see: Any grab bars or handrails. First and last steps. Where the edge of each step is. Use tools that help you move around (mobility aids) if they are needed. These include: Canes. Walkers. Scooters. Crutches. Turn on the lights when you go into a dark area. Replace any light bulbs as soon as they burn out. Set up your furniture so you have a clear path. Avoid moving your furniture around. If any of your floors are uneven, fix them. If there are any pets around you, be aware of where they are. Review your medicines with your doctor. Some medicines can make you feel dizzy. This can increase your chance of falling. Ask your doctor what other things that you can do to help prevent falls. This information is not intended to replace advice given to you by your health care provider. Make sure you discuss any questions you have with your health care provider. Document Released: 08/18/2009 Document Revised: 03/29/2016 Document Reviewed: 11/26/2014 Elsevier Interactive Patient Education  2017 [employer].

## 2021-11-29 NOTE — Progress Notes (Signed)
Subjective:   Ricky Garcia is a 71 y.o. male who presents for an Initial Medicare Annual Wellness Visit.  Review of Systems    Virtual Visit via Telephone Note  I connected with  JAYMOND WAAGE on 11/29/21 at  1:15 PM EST by telephone and verified that I am speaking with the correct person using two identifiers.  Location: Patient: HOME Provider: [facility] Persons participating in the virtual visit: patient/Nurse Health Advisor   I discussed the limitations, risks, security and privacy concerns of performing an evaluation and management service by telephone and the availability of in person appointments. The patient expressed understanding and agreed to proceed.  Interactive audio and video telecommunications were attempted between this nurse and patient, however failed, due to patient having technical difficulties OR patient did not have access to video capability.  We continued and completed visit with audio only.  Some vital signs may be absent or patient reported.   Darral Dash, LPN  Cardiac Risk Factors include: dyslipidemia;hypertension;male gender;advanced age (>73men, >44 women);sedentary lifestyle;obesity (BMI >30kg/m2)  PHONE VISIT. PT AT HOME, NURSE AT [facility].    Objective:    Today's Vitals   11/29/21 1332  Weight: 213 lb (96.6 kg)  Height: 5' 8.5" (1.74 m)   Body mass index is 31.92 kg/m.  Advanced Directives 11/29/2021 09/30/2018  Does Patient Have a Medical Advance Directive? No No  Would patient like information on creating a medical advance directive? No - Patient declined -    Current Medications (verified) Outpatient Encounter Medications as of 11/29/2021  Medication Sig   diclofenac (VOLTAREN) 75 MG EC tablet Take 1 tablet (75 mg total) by mouth 2 (two) times daily.   doxycycline (VIBRA-TABS) 100 MG tablet Take 1 tablet (100 mg total) by mouth 2 (two) times daily.   lisinopril (ZESTRIL) 10 MG tablet Take 1 tablet (10 mg total) by mouth daily.    rosuvastatin (CRESTOR) 10 MG tablet Take 1 tablet (10 mg total) by mouth daily.   No facility-administered encounter medications on file as of 11/29/2021.    Allergies (verified) Penicillins   History: No past medical history on file. No past surgical history on file. Family History  Problem Relation Age of Onset   Cancer Father        Emphezema   Social History   Socioeconomic History   Marital status: Divorced    Spouse name: Not on file   Number of children: Not on file   Years of education: Not on file   Highest education level: Not on file  Occupational History   Not on file  Tobacco Use   Smoking status: Never   Smokeless tobacco: Never  Vaping Use   Vaping Use: Never used  Substance and Sexual Activity   Alcohol use: Yes    Comment: rare   Drug use: No   Sexual activity: Not on file  Other Topics Concern   Not on file  Social History Narrative   Not on file   Social Determinants of Health   Financial Resource Strain: Low Risk    Difficulty of Paying Living Expenses: Not hard at all  Food Insecurity: No Food Insecurity   Worried About Programme researcher, broadcasting/film/video in the Last Year: Never true   Ran Out of Food in the Last Year: Never true  Transportation Needs: No Transportation Needs   Lack of Transportation (Medical): No   Lack of Transportation (Non-Medical): No  Physical Activity: Inactive   Days of  Exercise per Week: 0 days   Minutes of Exercise per Session: 0 min  Stress: No Stress Concern Present   Feeling of Stress : Not at all  Social Connections: Moderately Integrated   Frequency of Communication with Friends and Family: More than three times a week   Frequency of Social Gatherings with Friends and Family: More than three times a week   Attends Religious Services: 1 to 4 times per year   Active Member of [employer] or Organizations: Yes   Attends Banker Meetings: 1 to 4 times per year   Marital Status: Divorced    Tobacco  Counseling Counseling given: Not Answered   Clinical Intake:  Pre-visit preparation completed: Yes  Pain : No/denies pain     BMI - recorded: 31.92 Nutritional Status: BMI > 30  Obese Nutritional Risks: None Diabetes: No  How often do you need to have someone help you when you read instructions, pamphlets, or other written materials from your doctor or pharmacy?: 1 - Never  Diabetic?NO  Interpreter Needed?: No  Information entered by :: mj Maydell Knoebel, lpn   Activities of Daily Living In your present state of health, do you have any difficulty performing the following activities: 11/29/2021  Hearing? N  Vision? N  Difficulty concentrating or making decisions? N  Walking or climbing stairs? N  Dressing or bathing? N  Doing errands, shopping? N  Preparing Food and eating ? N  Using the Toilet? N  In the past six months, have you accidently leaked urine? N  Do you have problems with loss of bowel control? N  Managing your Medications? N  Managing your Finances? N  Housekeeping or managing your Housekeeping? N  Some recent data might be hidden    Patient Care Team: Junie [name], FNP as PCP - General (Family Medicine)  Indicate any recent Medical Services you may have received from other than [facility] providers in the past year (date may be approximate).     Assessment:   This is a routine wellness examination for [place].  Hearing/Vision screen Hearing Screening - Comments:: No hearing issues.  Vision Screening - Comments:: Glasses. [facility] [place]. 2022.  Dietary issues and exercise activities discussed: Current Exercise Habits: The patient has a physically strenuous job, but has no regular exercise apart from work., Exercise limited by: orthopedic condition(s);cardiac condition(s)   Goals Addressed             This Visit's Progress    Exercise 3x per week (30 min per time)         Depression Screen PHQ 2/9 Scores 11/29/2021 11/15/2021 01/11/2021 12/27/2020  08/14/2018 05/07/2018 04/23/2018  PHQ - 2 Score 0 0 0 2 0 0 0  PHQ- 9 Score - 0 - 3 - - -    Fall Risk Fall Risk  11/29/2021 11/15/2021 01/11/2021 12/27/2020 08/14/2018  Falls in the past year? 0 0 0 0 No  Number falls in past yr: 0 - - - -  Injury with Fall? 0 - - - -  Risk for fall due to : No Fall Risks - - - -  Follow up Falls prevention discussed Falls evaluation completed - - -    FALL RISK PREVENTION PERTAINING TO THE HOME:  Any stairs in or around the home? Yes  If so, are there any without handrails? No  Home free of loose throw rugs in walkways, pet beds, electrical cords, etc? Yes  Adequate lighting in your home to reduce risk of  falls? Yes   ASSISTIVE DEVICES UTILIZED TO PREVENT FALLS:  Life alert? No  Use of a cane, walker or w/c? No  Grab bars in the bathroom? No  Shower chair or bench in shower? No  Elevated toilet seat or a handicapped toilet? No   TIMED UP AND GO:  Was the test performed? No .  Phone visit.  Cognitive Function:     6CIT Screen 11/29/2021  What Year? 0 points  What month? 0 points  What time? 0 points  Count back from 20 0 points  Months in reverse 2 points  Repeat phrase 0 points  Total Score 2    Immunizations Immunization History  Administered Date(s) Administered   Fluad Quad(high Dose 65+) 12/27/2020   Influenza Inj Mdck Quad Pf 11/17/2015   Influenza, High Dose Seasonal PF 08/27/2017, 09/02/2021   Influenza,trivalent, recombinat, inj, PF 10/22/2012   Influenza-Unspecified 11/17/2015   Moderna Sars-Covid-2 Vaccination 03/21/2020, 04/18/2020   PFIZER(Purple Top)SARS-COV-2 Vaccination 06/30/2020, 07/28/2020   Pneumococcal Conjugate-13 03/20/2018   Pneumococcal Polysaccharide-23 04/20/2014, 12/27/2020   Td 10/22/2009   Tdap 03/20/2018    TDAP status: Up to date  Flu Vaccine status: Up to date  Pneumococcal vaccine status: Up to date  Covid-19 vaccine status: Completed vaccines  Qualifies for Shingles Vaccine? Yes    Zostavax completed No   Shingrix Completed?: No.    Education has been provided regarding the importance of this vaccine. Patient has been advised to call insurance company to determine out of pocket expense if they have not yet received this vaccine. Advised may also receive vaccine at local pharmacy or Health Dept. Verbalized acceptance and understanding.  Screening Tests Health Maintenance  Topic Date Due   Fecal DNA (Cologuard)  Never done   COVID-19 Vaccine (5 - Booster for Moderna series) 09/22/2020   Zoster Vaccines- Shingrix (1 of 2) 02/18/2022 (Originally 01/10/2001)   TETANUS/TDAP  03/20/2028   Pneumonia Vaccine 5565+ Years old  Completed   INFLUENZA VACCINE  Completed   Hepatitis C Screening  Completed   HPV VACCINES  Aged Out    Health Maintenance  Health Maintenance Due  Topic Date Due   Fecal DNA (Cologuard)  Never done   COVID-19 Vaccine (5 - Booster for Moderna series) 09/22/2020    Colorectal cancer screening: Referral to GI placed 11/29/2021 for Cologuard. Pt aware the office will call re: appt.  Lung Cancer Screening: (Low Dose CT Chest recommended if Age 15-80 years, 30 pack-year currently smoking OR have quit w/in 15years.) does not qualify.    Additional Screening:  Hepatitis C Screening: does qualify; Completed 03/20/2018  Vision Screening: Recommended annual ophthalmology exams for early detection of glaucoma and other disorders of the eye. Is the patient up to date with their annual eye exam?  Yes  Who is the provider or what is the name of the office in which the patient attends annual eye exams? [facility] [place] If pt is not established with a provider, would they like to be referred to a provider to establish care? No .   Dental Screening: Recommended annual dental exams for proper oral hygiene  Community Resource Referral / Chronic Care Management: CRR required this visit?  No   CCM required this visit?  No      Plan:     I have personally  reviewed and noted the following in the patients chart:   Medical and social history Use of alcohol, tobacco or illicit drugs  Current medications and supplements including opioid  prescriptions. Patient is not currently taking opioid prescriptions. Functional ability and status Nutritional status Physical activity Advanced directives List of other physicians Hospitalizations, surgeries, and ER visits in previous 12 months Vitals Screenings to include cognitive, depression, and falls Referrals and appointments  In addition, I have reviewed and discussed with patient certain preventive protocols, quality metrics, and best practice recommendations. A written personalized care plan for preventive services as well as general preventive health recommendations were provided to patient.     Darral DashMary Jane K Skie Vitrano, LPN   9/60/45401/25/2023   Nurse Notes: Discussed cologuard and patient states he would like to repeat. Order placed today. Up to date on all vaccines except Shingirx, discussed and how to obtain.

## 2022-01-04 ENCOUNTER — Encounter: Payer: Self-pay | Admitting: Family Medicine

## 2022-01-04 ENCOUNTER — Ambulatory Visit (INDEPENDENT_AMBULATORY_CARE_PROVIDER_SITE_OTHER): Payer: Medicare HMO | Admitting: Family Medicine

## 2022-01-04 VITALS — BP 143/73 | HR 70 | Temp 98.3°F | Ht 68.5 in | Wt 214.0 lb

## 2022-01-04 DIAGNOSIS — H11422 Conjunctival edema, left eye: Secondary | ICD-10-CM

## 2022-01-04 DIAGNOSIS — S0502XA Injury of conjunctiva and corneal abrasion without foreign body, left eye, initial encounter: Secondary | ICD-10-CM

## 2022-01-04 NOTE — Progress Notes (Addendum)
?  ? ?Subjective:  ?Patient ID: Ricky Garcia, male    DOB: [birth year], 71 y.o.   MRN: [id number] ? ?Patient Care Team: ?Sharion Balloon, FNP as PCP - General (Family Medicine)  ? ?Chief Complaint:  Belepharitis ? ? ?HPI: ?Ricky Garcia is a 71 y.o. male presenting on 01/04/2022 for Belepharitis ? ? ?2 day history of right eye pain, swelling and drainage. He reports trauma to the eye at work where he works in a [employer] and states "something got in his eye". He does not use safety glasses but wears his prescription glasses.  ? ? ?Relevant past medical, surgical, family, and social history reviewed and updated as indicated.  ?Allergies and medications reviewed and updated. Data reviewed: Chart in Epic. ? ? ?Social History  ? ?Socioeconomic History  ? Marital status: Divorced  ?  Spouse name: Not on file  ? Number of children: Not on file  ? Years of education: Not on file  ? Highest education level: Not on file  ?Occupational History  ? Not on file  ?Tobacco Use  ? Smoking status: Never  ? Smokeless tobacco: Never  ?Vaping Use  ? Vaping Use: Never used  ?Substance and Sexual Activity  ? Alcohol use: Yes  ?  Comment: rare  ? Drug use: No  ? Sexual activity: Not on file  ?Other Topics Concern  ? Not on file  ?Social History Narrative  ? Not on file  ? ?Social Determinants of Health  ? ?Financial Resource Strain: Low Risk   ? Difficulty of Paying Living Expenses: Not hard at all  ?Food Insecurity: No Food Insecurity  ? Worried About Charity fundraiser in the Last Year: Never true  ? Ran Out of Food in the Last Year: Never true  ?Transportation Needs: No Transportation Needs  ? Lack of Transportation (Medical): No  ? Lack of Transportation (Non-Medical): No  ?Physical Activity: Inactive  ? Days of Exercise per Week: 0 days  ? Minutes of Exercise per Session: 0 min  ?Stress: No Stress Concern Present  ? Feeling of Stress : Not at all  ?Social Connections: Moderately Integrated  ? Frequency of Communication  with Friends and Family: More than three times a week  ? Frequency of Social Gatherings with Friends and Family: More than three times a week  ? Attends Religious Services: 1 to 4 times per year  ? Active Member of Clubs or Organizations: Yes  ? Attends Archivist Meetings: 1 to 4 times per year  ? Marital Status: Divorced  ?Intimate Partner Violence: Not At Risk  ? Fear of Current or Ex-Partner: No  ? Emotionally Abused: No  ? Physically Abused: No  ? Sexually Abused: No  ? ? ?Outpatient Encounter Medications as of 01/04/2022  ?Medication Sig  ? diclofenac (VOLTAREN) 75 MG EC tablet Take 1 tablet (75 mg total) by mouth 2 (two) times daily.  ? lisinopril (ZESTRIL) 10 MG tablet Take 1 tablet (10 mg total) by mouth daily.  ? rosuvastatin (CRESTOR) 10 MG tablet Take 1 tablet (10 mg total) by mouth daily.  ? [DISCONTINUED] doxycycline (VIBRA-TABS) 100 MG tablet Take 1 tablet (100 mg total) by mouth 2 (two) times daily.  ? ?No facility-administered encounter medications on file as of 01/04/2022.  ? ? ?Allergies  ?Allergen Reactions  ? Penicillins   ? ? ?Review of Systems  ?Constitutional:  Negative for activity change, appetite change, chills, diaphoresis, fatigue, fever and unexpected weight change.  ?  Eyes:  Positive for pain, discharge, redness, itching and visual disturbance. Negative for photophobia.  ?Respiratory:  Negative for cough and shortness of breath.   ?Cardiovascular:  Negative for chest pain, palpitations and leg swelling.  ?Gastrointestinal:  Negative for abdominal pain.  ?Genitourinary:  Negative for decreased urine volume and difficulty urinating.  ?Neurological:  Negative for dizziness, tremors, seizures, syncope, facial asymmetry, speech difficulty, weakness, light-headedness, numbness and headaches.  ?Psychiatric/Behavioral:  Negative for confusion.   ?All other systems reviewed and are negative. ? ?   ? ?Objective:  ?BP (!) 143/73   Pulse 70   Temp 98.3 ?F (36.8 ?C) (Temporal)   Ht 5' 8.5"  (1.74 m)   Wt 97.1 kg   BMI 32.07 kg/m?   ? ?Wt Readings from Last 3 Encounters:  ?01/04/22 97.1 kg  ?11/29/21 96.6 kg  ?11/20/21 96.6 kg  ? ? ?Physical Exam ?Vitals and nursing note reviewed.  ?Constitutional:   ?   General: He is in acute distress.  ?   Appearance: He is obese. He is not toxic-appearing or diaphoretic.  ?HENT:  ?   Head: Normocephalic and atraumatic.  ?Eyes:  ?   General: Vision grossly intact. Visual field deficit present.     ?   Right eye: Discharge present.     ?   Left eye: Discharge present. ?   Conjunctiva/sclera:  ?   Right eye: Right conjunctiva is injected.  ?   Left eye: Left conjunctiva is injected. Exudate present.  ?   Pupils:  ?   Left eye: Fluorescein uptake present.  ?   Funduscopic exam:    ?   Left eye: Papilledema present.  ?   Slit lamp exam: ?   Left eye: No photophobia.  ? ?Cardiovascular:  ?   Rate and Rhythm: Normal rate and regular rhythm.  ?   Heart sounds: Normal heart sounds. No murmur heard. ?  No friction rub. No gallop.  ?Pulmonary:  ?   Effort: Pulmonary effort is normal. No respiratory distress.  ?   Breath sounds: Normal breath sounds. No stridor. No wheezing, rhonchi or rales.  ?Chest:  ?   Chest wall: No tenderness.  ?Skin: ?   General: Skin is warm and dry.  ?   Capillary Refill: Capillary refill takes less than 2 seconds.  ?Neurological:  ?   General: No focal deficit present.  ?   Mental Status: He is alert and oriented to person, place, and time.  ?Psychiatric:     ?   Mood and Affect: Mood normal.     ?   Behavior: Behavior normal.     ?   Thought Content: Thought content normal.     ?   Judgment: Judgment normal.  ? ? ?Results for orders placed or performed in visit on 01/11/21  ?BMP8+EGFR  ?Result Value Ref Range  ? Glucose 87 65 - 99 mg/dL  ? BUN 11 8 - 27 mg/dL  ? Creatinine, Ser 0.98 0.76 - 1.27 mg/dL  ? eGFR 83 >59 mL/min/1.73  ? BUN/Creatinine Ratio 11 10 - 24  ? Sodium 142 134 - 144 mmol/L  ? Potassium 4.8 3.5 - 5.2 mmol/L  ? Chloride 103 96 -  106 mmol/L  ? CO2 22 20 - 29 mmol/L  ? Calcium 9.5 8.6 - 10.2 mg/dL  ? ?   ?Fluorescein staining to bilateral eyes.  ? ?Pertinent labs & imaging results that were available during my care of the patient were reviewed  by me and considered in my medical decision making. ? ?Assessment & Plan:  ?Mostyn was seen today for belepharitis. ? ?Diagnoses and all orders for this visit: ? ?Abrasion of left cornea, initial encounter ?Conjunctival edema of left eye ?- Vision grossly intact. Significant eye trauma noted on exam, multiple abrasions noted under fluorescein staining. for Sent to Dr. Marin Comment for immediate evaluation.  ?  ? ?Continue all other maintenance medications. ? ?Follow up plan: ?Sent to Dr. Gus Puma office for immediate evaluation  ? ? ?Continue healthy lifestyle choices, including diet (rich in fruits, vegetables, and lean proteins, and low in salt and simple carbohydrates) and exercise (at least 30 minutes of moderate physical activity daily). ? ? ? ?The above assessment and management plan was discussed with the patient. The patient verbalized understanding of and has agreed to the management plan. Patient is aware to call the clinic if they develop any new symptoms or if symptoms persist or worsen. Patient is aware when to return to the clinic for a follow-up visit. Patient educated on when it is appropriate to go to the emergency department.  ? ?Addison Jossalyn Forgione, NP-S ? ?I personally was present during the history, physical exam, and medical decision-making activities of this visit and have verified that the services and findings are accurately documented in the nurse practitioner student's note. ? ?Monia Pouch, FNP-C ?[facility] ?[address] ?[place], [place] [address] ?[phone] ? ? ?

## 2022-01-05 LAB — COLOGUARD

## 2022-02-03 ENCOUNTER — Other Ambulatory Visit: Payer: Self-pay | Admitting: Family

## 2022-02-03 DIAGNOSIS — I10 Essential (primary) hypertension: Secondary | ICD-10-CM

## 2022-02-21 ENCOUNTER — Other Ambulatory Visit: Payer: Self-pay | Admitting: Family

## 2022-02-21 DIAGNOSIS — I10 Essential (primary) hypertension: Secondary | ICD-10-CM

## 2022-02-22 ENCOUNTER — Encounter: Payer: Self-pay | Admitting: Family

## 2022-02-22 NOTE — Telephone Encounter (Signed)
[place] NTBS last OV for ckup 12/27/20 ?

## 2022-02-22 NOTE — Telephone Encounter (Signed)
Called to schedule appt, no answer. Letter sent.  

## 2022-02-27 ENCOUNTER — Other Ambulatory Visit: Payer: Self-pay | Admitting: Family

## 2022-02-27 DIAGNOSIS — I10 Essential (primary) hypertension: Secondary | ICD-10-CM

## 2022-03-22 ENCOUNTER — Telehealth: Payer: Self-pay | Admitting: *Deleted

## 2022-03-22 DIAGNOSIS — I10 Essential (primary) hypertension: Secondary | ICD-10-CM

## 2022-03-22 MED ORDER — ROSUVASTATIN CALCIUM 10 MG PO TABS: 10.0000 mg | ORAL_TABLET | Freq: Every day | ORAL | 0 refills | Status: DC

## 2022-03-22 MED ORDER — LISINOPRIL 10 MG PO TABS: 10.0000 mg | ORAL_TABLET | Freq: Every day | ORAL | 0 refills | Status: DC

## 2022-03-22 NOTE — Telephone Encounter (Signed)
TC from pt needing his BP & cholesterol med refill Denied last month d/t no chronic ckup since 01/2021 Appt made for 04/13/22. 30 day RF sent to pharmacy

## 2022-04-13 ENCOUNTER — Ambulatory Visit: Payer: Medicare HMO | Admitting: Family

## 2022-04-13 ENCOUNTER — Other Ambulatory Visit: Payer: Self-pay | Admitting: Family

## 2022-04-13 DIAGNOSIS — I10 Essential (primary) hypertension: Secondary | ICD-10-CM

## 2022-04-16 ENCOUNTER — Encounter: Payer: Self-pay | Admitting: Family

## 2022-05-11 ENCOUNTER — Telehealth: Payer: Self-pay | Admitting: Family

## 2022-05-11 DIAGNOSIS — I10 Essential (primary) hypertension: Secondary | ICD-10-CM

## 2022-05-11 MED ORDER — LISINOPRIL 10 MG PO TABS: 10.0000 mg | ORAL_TABLET | Freq: Every day | ORAL | 0 refills | Status: DC

## 2022-05-11 MED ORDER — ROSUVASTATIN CALCIUM 10 MG PO TABS: 10.0000 mg | ORAL_TABLET | Freq: Every day | ORAL | 0 refills | Status: DC

## 2022-05-11 NOTE — Telephone Encounter (Signed)
  Prescription Request  05/11/2022  Is this a "Controlled Substance" medicine? no  Have you seen your PCP in the last 2 weeks? no  If YES, route message to pool  -  If NO, patient needs to be scheduled for appointment.  What is the name of the medication or equipment? Lisinopril 10 mg,Rosuvastatin 10 mg  Have you contacted your pharmacy to request a refill? yes   Which pharmacy would you like this sent to? [facility]   Patient notified that their request is being sent to the clinical staff for review and that they should receive a response within 2 business days.

## 2022-05-11 NOTE — Telephone Encounter (Signed)
LM calling patient that we called in prescriptions until he sees christy

## 2022-05-31 ENCOUNTER — Encounter: Payer: Self-pay | Admitting: Family

## 2022-05-31 ENCOUNTER — Ambulatory Visit: Payer: Medicare HMO | Admitting: Family

## 2022-08-03 ENCOUNTER — Other Ambulatory Visit: Payer: Self-pay | Admitting: Family

## 2022-08-03 DIAGNOSIS — I10 Essential (primary) hypertension: Secondary | ICD-10-CM

## 2022-09-04 ENCOUNTER — Ambulatory Visit (INDEPENDENT_AMBULATORY_CARE_PROVIDER_SITE_OTHER): Payer: Medicare HMO | Admitting: Family Medicine

## 2022-09-04 ENCOUNTER — Ambulatory Visit (HOSPITAL_COMMUNITY): Admission: RE | Admit: 2022-09-04 | Payer: Medicare HMO | Source: Ambulatory Visit

## 2022-09-04 ENCOUNTER — Encounter: Payer: Self-pay | Admitting: Family Medicine

## 2022-09-04 VITALS — BP 168/92 | Temp 98.1°F | Ht 68.5 in | Wt 211.0 lb

## 2022-09-04 DIAGNOSIS — Z23 Encounter for immunization: Secondary | ICD-10-CM | POA: Diagnosis not present

## 2022-09-04 DIAGNOSIS — R42 Dizziness and giddiness: Secondary | ICD-10-CM | POA: Diagnosis not present

## 2022-09-04 DIAGNOSIS — I10 Essential (primary) hypertension: Secondary | ICD-10-CM

## 2022-09-04 DIAGNOSIS — R2681 Unsteadiness on feet: Secondary | ICD-10-CM | POA: Diagnosis not present

## 2022-09-04 DIAGNOSIS — E782 Mixed hyperlipidemia: Secondary | ICD-10-CM

## 2022-09-04 MED ORDER — ROSUVASTATIN CALCIUM 10 MG PO TABS: 10.0000 mg | ORAL_TABLET | Freq: Every day | ORAL | 1 refills | Status: DC

## 2022-09-04 MED ORDER — LISINOPRIL 20 MG PO TABS: 20.0000 mg | ORAL_TABLET | Freq: Every day | ORAL | 3 refills | Status: DC

## 2022-09-04 NOTE — Progress Notes (Signed)
Subjective:  Patient ID: Ricky Garcia, male    DOB: December 04, 1950, 71 y.o.   MRN: [id number]  Patient Care Team: Sharion Balloon, FNP as PCP - General (Family Medicine)   Chief Complaint:  Dizziness (Happens after standing)   HPI: Ricky Garcia is a 71 y.o. male presenting on 09/04/2022 for Dizziness (Happens after standing)   Pt presents today complaints of dizziness for the last 3 days, worse in the mornings and with standing. Has not been checking blood pressure at home. Denies nausea or vomiting with the dizziness but does have gait instability and leg weakness. No other reported deficits. He states he needs his lisinopril and crestor refilled as he is about to run out. Reports compliance with medications without associated side effects.   Dizziness This is a new problem. Episode onset: past 3 days. Associated symptoms include weakness. Pertinent negatives include no abdominal pain, anorexia, arthralgias, change in bowel habit, chest pain, chills, congestion, coughing, diaphoresis, fatigue, fever, headaches, joint swelling, myalgias, nausea, neck pain, numbness, rash, sore throat, swollen glands, urinary symptoms, vertigo, visual change or vomiting. The symptoms are aggravated by standing. He has tried nothing for the symptoms. The treatment provided no relief.    Relevant past medical, surgical, family, and social history reviewed and updated as indicated.  Allergies and medications reviewed and updated. Data reviewed: Chart in Epic.   History reviewed. No pertinent past medical history.  History reviewed. No pertinent surgical history.  Social History   Socioeconomic History   Marital status: Divorced    Spouse name: Not on file   Number of children: Not on file   Years of education: Not on file   Highest education level: Not on file  Occupational History   Not on file  Tobacco Use   Smoking status: Never   Smokeless tobacco: Never  Vaping Use   Vaping Use:  Never used  Substance and Sexual Activity   Alcohol use: Yes    Comment: rare   Drug use: No   Sexual activity: Not on file  Other Topics Concern   Not on file  Social History Narrative   Not on file   Social Determinants of Health   Financial Resource Strain: Low Risk  (11/29/2021)   Overall Financial Resource Strain (CARDIA)    Difficulty of Paying Living Expenses: Not hard at all  Food Insecurity: No Food Insecurity (11/29/2021)   Hunger Vital Sign    Worried About Running Out of Food in the Last Year: Never true    [place] in the Last Year: Never true  Transportation Needs: No Transportation Needs (11/29/2021)   PRAPARE - Hydrologist (Medical): No    Lack of Transportation (Non-Medical): No  Physical Activity: Inactive (11/29/2021)   Exercise Vital Sign    Days of Exercise per Week: 0 days    Minutes of Exercise per Session: 0 min  Stress: No Stress Concern Present (11/29/2021)   [place]    Feeling of Stress : Not at all  Social Connections: Moderately Integrated (11/29/2021)   Social Connection and Isolation Panel [NHANES]    Frequency of Communication with Friends and Family: More than three times a week    Frequency of Social Gatherings with Friends and Family: More than three times a week    Attends Religious Services: 1 to 4 times per year    Active Member of [employer]  or Organizations: Yes    Attends Archivist Meetings: 1 to 4 times per year    Marital Status: Divorced  Intimate Partner Violence: Not At Risk (11/29/2021)   Humiliation, Afraid, Rape, and Kick questionnaire    Fear of Current or Ex-Partner: No    Emotionally Abused: No    Physically Abused: No    Sexually Abused: No    Outpatient Encounter Medications as of 09/04/2022  Medication Sig   diclofenac (VOLTAREN) 75 MG EC tablet Take 1 tablet (75 mg total) by mouth 2 (two) times daily.    [DISCONTINUED] lisinopril (ZESTRIL) 10 MG tablet Take 1 tablet (10 mg total) by mouth daily.   [DISCONTINUED] lisinopril (ZESTRIL) 20 MG tablet Take 1 tablet (20 mg total) by mouth daily.   [DISCONTINUED] rosuvastatin (CRESTOR) 10 MG tablet Take 1 tablet (10 mg total) by mouth daily.   lisinopril (ZESTRIL) 20 MG tablet Take 1 tablet (20 mg total) by mouth daily.   rosuvastatin (CRESTOR) 10 MG tablet Take 1 tablet (10 mg total) by mouth daily.   [DISCONTINUED] rosuvastatin (CRESTOR) 10 MG tablet Take 1 tablet (10 mg total) by mouth daily.   No facility-administered encounter medications on file as of 09/04/2022.    Allergies  Allergen Reactions   Penicillins     Review of Systems  Constitutional:  Negative for activity change, appetite change, chills, diaphoresis, fatigue, fever and unexpected weight change.  HENT: Negative.  Negative for congestion and sore throat.   Eyes:  Negative for photophobia and visual disturbance.  Respiratory:  Negative for cough, chest tightness and shortness of breath.   Cardiovascular:  Negative for chest pain, palpitations and leg swelling.  Gastrointestinal:  Negative for abdominal pain, anorexia, blood in stool, change in bowel habit, constipation, diarrhea, nausea and vomiting.  Endocrine: Negative.   Genitourinary:  Negative for decreased urine volume, difficulty urinating, dysuria, frequency and urgency.  Musculoskeletal:  Negative for arthralgias, joint swelling, myalgias and neck pain.  Skin: Negative.  Negative for rash.  Allergic/Immunologic: Negative.   Neurological:  Positive for dizziness and weakness. Negative for vertigo, tremors, seizures, syncope, facial asymmetry, speech difficulty, light-headedness, numbness and headaches.  Hematological: Negative.   Psychiatric/Behavioral:  Negative for confusion, hallucinations, sleep disturbance and suicidal ideas.   All other systems reviewed and are negative.       Objective:  BP (!) 168/92    Temp 98.1 F (36.7 C) (Oral)   Ht 5' 8.5" (1.74 m)   Wt 211 lb (95.7 kg)   SpO2 97%   BMI 31.62 kg/m    Wt Readings from Last 3 Encounters:  09/04/22 211 lb (95.7 kg)  01/04/22 214 lb (97.1 kg)  11/29/21 213 lb (96.6 kg)    Physical Exam Vitals and nursing note reviewed.  Constitutional:      General: He is not in acute distress.    Appearance: Normal appearance. He is well-developed and well-groomed. He is not ill-appearing, toxic-appearing or diaphoretic.  HENT:     Head: Normocephalic and atraumatic.     Jaw: There is normal jaw occlusion.     Right Ear: Hearing normal.     Left Ear: Hearing normal.     Nose: Nose normal.     Mouth/Throat:     Lips: Pink.     Mouth: Mucous membranes are moist.     Pharynx: Oropharynx is clear. Uvula midline.  Eyes:     General: Lids are normal.     Extraocular Movements: Extraocular movements intact.  Conjunctiva/sclera: Conjunctivae normal.     Pupils: Pupils are equal, round, and reactive to light.  Neck:     Thyroid: No thyroid mass, thyromegaly or thyroid tenderness.     Vascular: No carotid bruit or JVD.     Trachea: Trachea and phonation normal.  Cardiovascular:     Rate and Rhythm: Normal rate and regular rhythm.     Chest Wall: PMI is not displaced.     Pulses: Normal pulses.     Heart sounds: Normal heart sounds. No murmur heard.    No friction rub. No gallop.  Pulmonary:     Effort: Pulmonary effort is normal. No respiratory distress.     Breath sounds: Normal breath sounds. No wheezing.  Abdominal:     General: Bowel sounds are normal. There is no distension or abdominal bruit.     Palpations: Abdomen is soft. There is no hepatomegaly or splenomegaly.     Tenderness: There is no abdominal tenderness. There is no right CVA tenderness or left CVA tenderness.     Hernia: No hernia is present.  Musculoskeletal:        General: Normal range of motion.     Cervical back: Normal range of motion and neck supple.      Right lower leg: No edema.     Left lower leg: No edema.  Lymphadenopathy:     Cervical: No cervical adenopathy.  Skin:    General: Skin is warm and dry.     Capillary Refill: Capillary refill takes less than 2 seconds.     Coloration: Skin is not cyanotic, jaundiced or pale.     Findings: No rash.  Neurological:     General: No focal deficit present.     Mental Status: He is alert and oriented to person, place, and time.     Sensory: Sensation is intact.     Motor: Motor function is intact.     Coordination: Romberg sign positive.     Gait: Gait is intact.     Deep Tendon Reflexes: Reflexes are normal and symmetric.  Psychiatric:        Attention and Perception: Attention and perception normal.        Mood and Affect: Mood and affect normal.        Speech: Speech normal.        Behavior: Behavior normal. Behavior is cooperative.        Thought Content: Thought content normal.        Cognition and Memory: Cognition and memory normal.        Judgment: Judgment normal.     Results for orders placed or performed in visit on 11/29/21  Cologuard  Result Value Ref Range   COLOGUARD Sample Could Not Be Processed 3 N/A       Pertinent labs & imaging results that were available during my care of the patient were reviewed by me and considered in my medical decision making.  Assessment & Plan:  Zaheer was seen today for dizziness.  Diagnoses and all orders for this visit:  Dizziness Gait instability Ongoing for 3 days with some leg weakness. Does have positive Romberg in office. Will obtain head CT as symptoms are concerning for recent CVA. BP is not controlled, will increase lisinopril to 20 mg daily. Check CBC and BMP for anemia or electrolyte insufficiencies. Pt aware to report new, worsening, or persistent symptoms.  -     BMP8+EGFR -     CBC with Differential/Platelet -  CT HEAD WO CONTRAST (5MM); Future -     lisinopril (ZESTRIL) 20 MG tablet; Take 1 tablet (20 mg total)  by mouth daily.  Primary hypertension BP not controlled. Will increase lisinopril to 20 mg daily. DASH diet and exercise discussed. Due to reported dizziness, will obtain head CT. Labs pending. Pt to follow up in 2 weeks for repeat BP and BMP -     BMP8+EGFR -     CBC with Differential/Platelet -     CT HEAD WO CONTRAST (5MM); Future -     lisinopril (ZESTRIL) 20 MG tablet; Take 1 tablet (20 mg total) by mouth daily. -     Thyroid Panel With TSH  Mixed hyperlipidemia Diet encouraged - increase intake of fresh fruits and vegetables, increase intake of lean proteins. Bake, broil, or grill foods. Avoid fried, greasy, and fatty foods. Avoid fast foods. Increase intake of fiber-rich whole grains. Exercise encouraged - at least 150 minutes per week and advance as tolerated. Goal BMI < 25. Continue medications as prescribed. Follow up in 3-6 months as discussed.  -     Lipid panel -     rosuvastatin (CRESTOR) 10 MG tablet; Take 1 tablet (10 mg total) by mouth daily.  Influenza vaccine given today   Continue all other maintenance medications.  Follow up plan: Return in about 2 weeks (around 09/18/2022), or if symptoms worsen or fail to improve, for bp, dizziness.   The above assessment and management plan was discussed with the patient. The patient verbalized understanding of and has agreed to the management plan. Patient is aware to call the clinic if they develop any new symptoms or if symptoms persist or worsen. Patient is aware when to return to the clinic for a follow-up visit. Patient educated on when it is appropriate to go to the emergency department.   Monia Pouch, FNP-C [facility] [phone]

## 2022-09-05 LAB — CBC WITH DIFFERENTIAL/PLATELET
Basophils Absolute: 0 10*3/uL (ref 0.0–0.2)
Basos: 0 %
EOS (ABSOLUTE): 0.2 10*3/uL (ref 0.0–0.4)
Eos: 2 %
Hematocrit: 43 % (ref 37.5–51.0)
Hemoglobin: 13.9 g/dL (ref 13.0–17.7)
Immature Grans (Abs): 0 10*3/uL (ref 0.0–0.1)
Immature Granulocytes: 0 %
Lymphocytes Absolute: 1.8 10*3/uL (ref 0.7–3.1)
Lymphs: 21 %
MCH: 29.8 pg (ref 26.6–33.0)
MCHC: 32.3 g/dL (ref 31.5–35.7)
MCV: 92 fL (ref 79–97)
Monocytes Absolute: 0.6 10*3/uL (ref 0.1–0.9)
Monocytes: 7 %
Neutrophils Absolute: 6.1 10*3/uL (ref 1.4–7.0)
Neutrophils: 70 %
Platelets: 382 10*3/uL (ref 150–450)
RBC: 4.67 x10E6/uL (ref 4.14–5.80)
RDW: 12.8 % (ref 11.6–15.4)
WBC: 8.7 10*3/uL (ref 3.4–10.8)

## 2022-09-05 LAB — THYROID PANEL WITH TSH
Free Thyroxine Index: 1.5 (ref 1.2–4.9)
T3 Uptake Ratio: 25 % (ref 24–39)
T4, Total: 6 ug/dL (ref 4.5–12.0)
TSH: 3.86 u[IU]/mL (ref 0.450–4.500)

## 2022-09-05 LAB — BMP8+EGFR
BUN/Creatinine Ratio: 15 (ref 10–24)
BUN: 18 mg/dL (ref 8–27)
CO2: 23 mmol/L (ref 20–29)
Calcium: 9.2 mg/dL (ref 8.6–10.2)
Chloride: 107 mmol/L — ABNORMAL HIGH (ref 96–106)
Creatinine, Ser: 1.18 mg/dL (ref 0.76–1.27)
Glucose: 95 mg/dL (ref 70–99)
Potassium: 4.2 mmol/L (ref 3.5–5.2)
Sodium: 143 mmol/L (ref 134–144)
eGFR: 66 mL/min/{1.73_m2} (ref >59)

## 2022-09-05 LAB — LIPID PANEL
Chol/HDL Ratio: 4 ratio (ref 0.0–5.0)
Cholesterol, Total: 132 mg/dL (ref 100–199)
HDL: 33 mg/dL — ABNORMAL LOW (ref >39)
LDL Chol Calc (NIH): 67 mg/dL (ref 0–99)
Triglycerides: 189 mg/dL — ABNORMAL HIGH (ref 0–149)
VLDL Cholesterol Cal: 32 mg/dL (ref 5–40)

## 2022-09-06 ENCOUNTER — Ambulatory Visit (HOSPITAL_COMMUNITY)
Admission: RE | Admit: 2022-09-06 | Discharge: 2022-09-06 | Disposition: A | Discharge status: Home / Self Care | Payer: Medicare HMO | Source: Ambulatory Visit | Attending: Family Medicine | Admitting: Family Medicine

## 2022-09-06 DIAGNOSIS — R2681 Unsteadiness on feet: Secondary | ICD-10-CM | POA: Diagnosis not present

## 2022-09-06 DIAGNOSIS — I10 Essential (primary) hypertension: Secondary | ICD-10-CM | POA: Diagnosis not present

## 2022-09-06 DIAGNOSIS — R42 Dizziness and giddiness: Secondary | ICD-10-CM | POA: Insufficient documentation

## 2022-09-13 ENCOUNTER — Ambulatory Visit: Payer: Medicare HMO | Admitting: Family

## 2022-09-18 ENCOUNTER — Encounter: Payer: Self-pay | Admitting: Family

## 2022-09-18 ENCOUNTER — Ambulatory Visit (INDEPENDENT_AMBULATORY_CARE_PROVIDER_SITE_OTHER): Payer: Medicare HMO | Admitting: Family

## 2022-09-18 VITALS — BP 125/68 | HR 72 | Temp 98.0°F | Ht 68.5 in | Wt 214.8 lb

## 2022-09-18 DIAGNOSIS — E66811 Obesity, class 1: Secondary | ICD-10-CM

## 2022-09-18 DIAGNOSIS — M19012 Primary osteoarthritis, left shoulder: Secondary | ICD-10-CM | POA: Diagnosis not present

## 2022-09-18 DIAGNOSIS — E785 Hyperlipidemia, unspecified: Secondary | ICD-10-CM

## 2022-09-18 DIAGNOSIS — E669 Obesity, unspecified: Secondary | ICD-10-CM | POA: Diagnosis not present

## 2022-09-18 DIAGNOSIS — Z125 Encounter for screening for malignant neoplasm of prostate: Secondary | ICD-10-CM | POA: Diagnosis not present

## 2022-09-18 DIAGNOSIS — I10 Essential (primary) hypertension: Secondary | ICD-10-CM | POA: Diagnosis not present

## 2022-09-18 DIAGNOSIS — Z0000 Encounter for general adult medical examination without abnormal findings: Secondary | ICD-10-CM | POA: Diagnosis not present

## 2022-09-18 DIAGNOSIS — Z0001 Encounter for general adult medical examination with abnormal findings: Secondary | ICD-10-CM | POA: Diagnosis not present

## 2022-09-18 DIAGNOSIS — Z6832 Body mass index (BMI) 32.0-32.9, adult: Secondary | ICD-10-CM | POA: Diagnosis not present

## 2022-09-18 NOTE — Addendum Note (Signed)
Addended by: Jannifer Rodney A on: 09/18/2022 12:24 PM   Modules accepted: Level of Service

## 2022-09-18 NOTE — Patient Instructions (Signed)
Health Maintenance After Age 71 After age 71, you are at a higher risk for certain long-term diseases and infections as well as injuries from falls. Falls are a major cause of broken bones and head injuries in people who are older than age 71. Getting regular preventive care can help to keep you healthy and well. Preventive care includes getting regular testing and making lifestyle changes as recommended by your health care provider. Talk with your health care provider about: Which screenings and tests you should have. A screening is a test that checks for a disease when you have no symptoms. A diet and exercise plan that is right for you. What should I know about screenings and tests to prevent falls? Screening and testing are the best ways to find a health problem early. Early diagnosis and treatment give you the best chance of managing medical conditions that are common after age 71. Certain conditions and lifestyle choices may make you more likely to have a fall. Your health care provider may recommend: Regular vision checks. Poor vision and conditions such as cataracts can make you more likely to have a fall. If you wear glasses, make sure to get your prescription updated if your vision changes. Medicine review. Work with your health care provider to regularly review all of the medicines you are taking, including over-the-counter medicines. Ask your health care provider about any side effects that may make you more likely to have a fall. Tell your health care provider if any medicines that you take make you feel dizzy or sleepy. Strength and balance checks. Your health care provider may recommend certain tests to check your strength and balance while standing, walking, or changing positions. Foot health exam. Foot pain and numbness, as well as not wearing proper footwear, can make you more likely to have a fall. Screenings, including: Osteoporosis screening. Osteoporosis is a condition that causes  the bones to get weaker and break more easily. Blood pressure screening. Blood pressure changes and medicines to control blood pressure can make you feel dizzy. Depression screening. You may be more likely to have a fall if you have a fear of falling, feel depressed, or feel unable to do activities that you used to do. Alcohol use screening. Using too much alcohol can affect your balance and may make you more likely to have a fall. Follow these instructions at home: Lifestyle Do not drink alcohol if: Your health care provider tells you not to drink. If you drink alcohol: Limit how much you have to: 0-1 drink a day for women. 0-2 drinks a day for men. Know how much alcohol is in your drink. In the [place] one drink equals one 12 oz bottle of beer (355 mL), one 5 oz glass of wine (148 mL), or one 1 oz glass of hard liquor (44 mL). Do not use any products that contain nicotine or tobacco. These products include cigarettes, chewing tobacco, and vaping devices, such as e-cigarettes. If you need help quitting, ask your health care provider. Activity  Follow a regular exercise program to stay fit. This will help you maintain your balance. Ask your health care provider what types of exercise are appropriate for you. If you need a cane or walker, use it as recommended by your health care provider. Wear supportive shoes that have nonskid soles. Safety  Remove any tripping hazards, such as rugs, cords, and clutter. Install safety equipment such as grab bars in bathrooms and safety rails on stairs. Keep rooms and walkways   well-lit. General instructions Talk with your health care provider about your risks for falling. Tell your health care provider if: You fall. Be sure to tell your health care provider about all falls, even ones that seem minor. You feel dizzy, tiredness (fatigue), or off-balance. Take over-the-counter and prescription medicines only as told by your health care provider. These include  supplements. Eat a healthy diet and maintain a healthy weight. A healthy diet includes low-fat dairy products, low-fat (lean) meats, and fiber from whole grains, beans, and lots of fruits and vegetables. Stay current with your vaccines. Schedule regular health, dental, and eye exams. Summary Having a healthy lifestyle and getting preventive care can help to protect your health and wellness after age 71. Screening and testing are the best way to find a health problem early and help you avoid having a fall. Early diagnosis and treatment give you the best chance for managing medical conditions that are more common for people who are older than age 71. Falls are a major cause of broken bones and head injuries in people who are older than age 71. Take precautions to prevent a fall at home. Work with your health care provider to learn what changes you can make to improve your health and wellness and to prevent falls. This information is not intended to replace advice given to you by your health care provider. Make sure you discuss any questions you have with your health care provider. Document Revised: 03/13/2021 Document Reviewed: 03/13/2021 Elsevier Patient Education  2023 Elsevier Inc.  

## 2022-09-18 NOTE — Progress Notes (Signed)
Subjective:    Patient ID: Ricky Garcia, male    DOB: September 14, 1951, 71 y.o.   MRN: [id number]  Chief Complaint  Patient presents with   Follow-up    Return in about 2 weeks (around 09/18/2022), or if symptoms worsen or fail to improve, for bp, dizziness.   PT presents to the office today for CPE and to  follow up on HTN. He was seen on 09/04/22 and his lisinopril was increased to 20 mg from 10 mg.  Hypertension This is a chronic problem. The current episode started more than 1 year ago. The problem has been waxing and waning since onset. The problem is uncontrolled. Pertinent negatives include no malaise/fatigue, peripheral edema or shortness of breath. Risk factors for coronary artery disease include dyslipidemia, obesity and male gender. Past treatments include ACE inhibitors. The current treatment provides no improvement. There is no history of heart failure.  Hyperlipidemia This is a chronic problem. The current episode started more than 1 year ago. The problem is controlled. Recent lipid tests were reviewed and are normal. Exacerbating diseases include obesity. Pertinent negatives include no shortness of breath. Current antihyperlipidemic treatment includes statins. The current treatment provides moderate improvement of lipids. Risk factors for coronary artery disease include dyslipidemia, male sex, hypertension and a sedentary lifestyle.  Arthritis Presents for follow-up visit. He complains of pain and stiffness. Affected locations include the right shoulder (back). His pain is at a severity of 4/10.      Review of Systems  Constitutional:  Negative for malaise/fatigue.  Respiratory:  Negative for shortness of breath.   Musculoskeletal:  Positive for arthritis and stiffness.  All other systems reviewed and are negative.  Family History  Problem Relation Age of Onset   Cancer Father        Emphezema   Social History   Socioeconomic History   Marital status: Divorced     Spouse name: Not on file   Number of children: Not on file   Years of education: Not on file   Highest education level: Not on file  Occupational History   Not on file  Tobacco Use   Smoking status: Never   Smokeless tobacco: Never  Vaping Use   Vaping Use: Never used  Substance and Sexual Activity   Alcohol use: Yes    Comment: rare   Drug use: No   Sexual activity: Not on file  Other Topics Concern   Not on file  Social History Narrative   Not on file   Social Determinants of Health   Financial Resource Strain: Low Risk  (11/29/2021)   Overall Financial Resource Strain (CARDIA)    Difficulty of Paying Living Expenses: Not hard at all  Food Insecurity: No Food Insecurity (11/29/2021)   Hunger Vital Sign    Worried About Running Out of Food in the Last Year: Never true    [place] in the Last Year: Never true  Transportation Needs: No Transportation Needs (11/29/2021)   PRAPARE - Hydrologist (Medical): No    Lack of Transportation (Non-Medical): No  Physical Activity: Inactive (11/29/2021)   Exercise Vital Sign    Days of Exercise per Week: 0 days    Minutes of Exercise per Session: 0 min  Stress: No Stress Concern Present (11/29/2021)   [place]    Feeling of Stress : Not at all  Social Connections: Moderately Integrated (11/29/2021)   Social  Connection and Isolation Panel [NHANES]    Frequency of Communication with Friends and Family: More than three times a week    Frequency of Social Gatherings with Friends and Family: More than three times a week    Attends Religious Services: 1 to 4 times per year    Active Member of [employer] or Organizations: Yes    Attends Archivist Meetings: 1 to 4 times per year    Marital Status: Divorced       Objective:   Physical Exam Vitals reviewed.  Constitutional:      General: He is not in acute distress.    Appearance:  He is well-developed. He is obese.  HENT:     Head: Normocephalic.     Right Ear: Tympanic membrane normal.     Left Ear: Tympanic membrane normal.  Eyes:     General:        Right eye: No discharge.        Left eye: No discharge.     Pupils: Pupils are equal, round, and reactive to light.  Neck:     Thyroid: No thyromegaly.  Cardiovascular:     Rate and Rhythm: Normal rate and regular rhythm.     Heart sounds: Normal heart sounds. No murmur heard. Pulmonary:     Effort: Pulmonary effort is normal. No respiratory distress.     Breath sounds: Normal breath sounds. No wheezing.  Abdominal:     General: Bowel sounds are normal. There is no distension.     Palpations: Abdomen is soft.     Tenderness: There is no abdominal tenderness.  Musculoskeletal:        General: No tenderness. Normal range of motion.     Cervical back: Normal range of motion and neck supple.  Skin:    General: Skin is warm and dry.     Findings: No erythema or rash.  Neurological:     Mental Status: He is alert and oriented to person, place, and time.     Cranial Nerves: No cranial nerve deficit.     Deep Tendon Reflexes: Reflexes are normal and symmetric.  Psychiatric:        Behavior: Behavior normal.        Thought Content: Thought content normal.        Judgment: Judgment normal.      BP 125/68   Pulse 72   Temp 98 F (36.7 C) (Temporal)   Ht 5' 8.5" (1.74 m)   Wt 214 lb 12.8 oz (97.4 kg)   SpO2 99%   BMI 32.19 kg/m       Assessment & Plan:  Ricky Garcia comes in today with chief complaint of Follow-up (Return in about 2 weeks (around 09/18/2022), or if symptoms worsen or fail to improve, for bp, dizziness.)   Diagnosis and orders addressed:  1. Annual physical exam - CMP14+EGFR - CBC with Differential/Platelet - Lipid panel - PSA, total and free - TSH  2. Hyperlipidemia, unspecified hyperlipidemia type  - CMP14+EGFR - CBC with Differential/Platelet - Lipid panel  3.  Primary hypertension - CMP14+EGFR - CBC with Differential/Platelet  4. Osteoarthritis of left shoulder, unspecified osteoarthritis type - CMP14+EGFR - CBC with Differential/Platelet  5. Class 1 obesity with serious comorbidity and body mass index (BMI) of 32.0 to 32.9 in adult, unspecified obesity type - CMP14+EGFR - CBC with Differential/Platelet   Labs pending Health Maintenance reviewed Diet and exercise encouraged  Follow up plan: 6  months  Evelina Dun, FNP

## 2022-09-19 LAB — CBC WITH DIFFERENTIAL/PLATELET
Basophils Absolute: 0 10*3/uL (ref 0.0–0.2)
Basos: 0 %
EOS (ABSOLUTE): 0.1 10*3/uL (ref 0.0–0.4)
Eos: 2 %
Hematocrit: 41.5 % (ref 37.5–51.0)
Hemoglobin: 14 g/dL (ref 13.0–17.7)
Immature Grans (Abs): 0 10*3/uL (ref 0.0–0.1)
Immature Granulocytes: 0 %
Lymphocytes Absolute: 1.6 10*3/uL (ref 0.7–3.1)
Lymphs: 20 %
MCH: 31 pg (ref 26.6–33.0)
MCHC: 33.7 g/dL (ref 31.5–35.7)
MCV: 92 fL (ref 79–97)
Monocytes Absolute: 0.6 10*3/uL (ref 0.1–0.9)
Monocytes: 7 %
Neutrophils Absolute: 5.7 10*3/uL (ref 1.4–7.0)
Neutrophils: 71 %
Platelets: 367 10*3/uL (ref 150–450)
RBC: 4.52 x10E6/uL (ref 4.14–5.80)
RDW: 12.5 % (ref 11.6–15.4)
WBC: 8 10*3/uL (ref 3.4–10.8)

## 2022-09-19 LAB — LIPID PANEL
Chol/HDL Ratio: 4.2 ratio (ref 0.0–5.0)
Cholesterol, Total: 134 mg/dL (ref 100–199)
HDL: 32 mg/dL — ABNORMAL LOW (ref >39)
LDL Chol Calc (NIH): 73 mg/dL (ref 0–99)
Triglycerides: 169 mg/dL — ABNORMAL HIGH (ref 0–149)
VLDL Cholesterol Cal: 29 mg/dL (ref 5–40)

## 2022-09-19 LAB — CMP14+EGFR
ALT: 25 IU/L (ref 0–44)
AST: 26 IU/L (ref 0–40)
Albumin/Globulin Ratio: 1.6 (ref 1.2–2.2)
Albumin: 4.2 g/dL (ref 3.8–4.8)
Alkaline Phosphatase: 111 IU/L (ref 44–121)
BUN/Creatinine Ratio: 15 (ref 10–24)
BUN: 14 mg/dL (ref 8–27)
Bilirubin Total: 0.6 mg/dL (ref 0.0–1.2)
CO2: 24 mmol/L (ref 20–29)
Calcium: 9.5 mg/dL (ref 8.6–10.2)
Chloride: 106 mmol/L (ref 96–106)
Creatinine, Ser: 0.92 mg/dL (ref 0.76–1.27)
Globulin, Total: 2.6 g/dL (ref 1.5–4.5)
Glucose: 81 mg/dL (ref 70–99)
Potassium: 4.6 mmol/L (ref 3.5–5.2)
Sodium: 143 mmol/L (ref 134–144)
Total Protein: 6.8 g/dL (ref 6.0–8.5)
eGFR: 89 mL/min/{1.73_m2} (ref >59)

## 2022-09-19 LAB — PSA, TOTAL AND FREE
PSA, Free Pct: 17.7 %
PSA, Free: 0.23 ng/mL
Prostate Specific Ag, Serum: 1.3 ng/mL (ref 0.0–4.0)

## 2022-09-19 LAB — TSH: TSH: 2.5 u[IU]/mL (ref 0.450–4.500)

## 2022-11-20 ENCOUNTER — Encounter: Payer: Self-pay | Admitting: Nurse Practitioner

## 2022-11-20 ENCOUNTER — Ambulatory Visit (INDEPENDENT_AMBULATORY_CARE_PROVIDER_SITE_OTHER): Payer: Medicare HMO | Admitting: Nurse Practitioner

## 2022-11-20 VITALS — BP 132/82

## 2022-11-20 DIAGNOSIS — Z024 Encounter for examination for driving license: Secondary | ICD-10-CM | POA: Diagnosis not present

## 2022-11-20 LAB — URINALYSIS
Bilirubin, UA: NEGATIVE
Glucose, UA: NEGATIVE
Ketones, UA: NEGATIVE
Leukocytes,UA: NEGATIVE
Nitrite, UA: NEGATIVE
Protein,UA: NEGATIVE
RBC, UA: NEGATIVE
Specific Gravity, UA: 1.02 (ref 1.005–1.030)
Urobilinogen, Ur: 0.2 mg/dL (ref 0.2–1.0)
pH, UA: 7 (ref 5.0–7.5)

## 2022-11-20 NOTE — Progress Notes (Signed)
[employer]- see Scanned in document

## 2022-11-20 NOTE — Addendum Note (Signed)
Addended by: Chevis Pretty on: 11/20/2022 02:34 PM   Modules accepted: Orders

## 2022-12-03 ENCOUNTER — Ambulatory Visit (INDEPENDENT_AMBULATORY_CARE_PROVIDER_SITE_OTHER): Payer: Medicare HMO

## 2022-12-03 VITALS — Ht 68.0 in | Wt 215.0 lb

## 2022-12-03 DIAGNOSIS — Z0000 Encounter for general adult medical examination without abnormal findings: Secondary | ICD-10-CM

## 2022-12-03 DIAGNOSIS — Z1211 Encounter for screening for malignant neoplasm of colon: Secondary | ICD-10-CM

## 2022-12-03 NOTE — Patient Instructions (Signed)
Ricky Garcia , Thank you for taking time to come for your Medicare Wellness Visit. I appreciate your ongoing commitment to your health goals. Please review the following plan we discussed and let me know if I can assist you in the future.   These are the goals we discussed:  Goals      Exercise 3x per week (30 min per time)        This is a list of the screening recommended for you and due dates:  Health Maintenance  Topic Date Due   Zoster (Shingles) Vaccine (1 of 2) Never done   COVID-19 Vaccine (5 - 2023-24 season) 07/06/2022   Cologuard (Stool DNA test)  09/19/2023*   Medicare Annual Wellness Visit  12/04/2023   DTaP/Tdap/Td vaccine (4 - Td or Tdap) 03/20/2028   Pneumonia Vaccine  Completed   Flu Shot  Completed   Hepatitis C Screening: USPSTF Recommendation to screen - Ages 18-79 yo.  Completed   HPV Vaccine  Aged Out  *Topic was postponed. The date shown is not the original due date.    Advanced directives: Advance directive discussed with you today. I have provided a copy for you to complete at home and have notarized. Once this is complete please bring a copy in to our office so we can scan it into your chart.   Conditions/risks identified: Aim for 30 minutes of exercise or brisk walking, 6-8 glasses of water, and 5 servings of fruits and vegetables each day.   Next appointment: Follow up in one year for your annual wellness visit.   Preventive Care 54 Years and Older, Male  Preventive care refers to lifestyle choices and visits with your health care provider that can promote health and wellness. What does preventive care include? A yearly physical exam. This is also called an annual well check. Dental exams once or twice a year. Routine eye exams. Ask your health care provider how often you should have your eyes checked. Personal lifestyle choices, including: Daily care of your teeth and gums. Regular physical activity. Eating a healthy diet. Avoiding tobacco and  drug use. Limiting alcohol use. Practicing safe sex. Taking low doses of aspirin every day. Taking vitamin and mineral supplements as recommended by your health care provider. What happens during an annual well check? The services and screenings done by your health care provider during your annual well check will depend on your age, overall health, lifestyle risk factors, and family history of disease. Counseling  Your health care provider may ask you questions about your: Alcohol use. Tobacco use. Drug use. Emotional well-being. Home and relationship well-being. Sexual activity. Eating habits. History of falls. Memory and ability to understand (cognition). Work and work Statistician. Screening  You may have the following tests or measurements: Height, weight, and BMI. Blood pressure. Lipid and cholesterol levels. These may be checked every 5 years, or more frequently if you are over 50 years old. Skin check. Lung cancer screening. You may have this screening every year starting at age 110 if you have a 30-pack-year history of smoking and currently smoke or have quit within the past 15 years. Fecal occult blood test (FOBT) of the stool. You may have this test every year starting at age 55. Flexible sigmoidoscopy or colonoscopy. You may have a sigmoidoscopy every 5 years or a colonoscopy every 10 years starting at age 64. Prostate cancer screening. Recommendations will vary depending on your family history and other risks. Hepatitis C blood test. Hepatitis B blood  test. Sexually transmitted disease (STD) testing. Diabetes screening. This is done by checking your blood sugar (glucose) after you have not eaten for a while (fasting). You may have this done every 1-3 years. Abdominal aortic aneurysm (AAA) screening. You may need this if you are a current or former smoker. Osteoporosis. You may be screened starting at age 39 if you are at high risk. Talk with your health care provider  about your test results, treatment options, and if necessary, the need for more tests. Vaccines  Your health care provider may recommend certain vaccines, such as: Influenza vaccine. This is recommended every year. Tetanus, diphtheria, and acellular pertussis (Tdap, Td) vaccine. You may need a Td booster every 10 years. Zoster vaccine. You may need this after age 7. Pneumococcal 13-valent conjugate (PCV13) vaccine. One dose is recommended after age 79. Pneumococcal polysaccharide (PPSV23) vaccine. One dose is recommended after age 17. Talk to your health care provider about which screenings and vaccines you need and how often you need them. This information is not intended to replace advice given to you by your health care provider. Make sure you discuss any questions you have with your health care provider. Document Released: 11/18/2015 Document Revised: 07/11/2016 Document Reviewed: 08/23/2015 Elsevier Interactive Patient Education  2017 [employer] Prevention in the Home Falls can cause injuries. They can happen to people of all ages. There are many things you can do to make your home safe and to help prevent falls. What can I do on the outside of my home? Regularly fix the edges of walkways and driveways and fix any cracks. Remove anything that might make you trip as you walk through a door, such as a raised step or threshold. Trim any bushes or trees on the path to your home. Use bright outdoor lighting. Clear any walking paths of anything that might make someone trip, such as rocks or tools. Regularly check to see if handrails are loose or broken. Make sure that both sides of any steps have handrails. Any raised decks and porches should have guardrails on the edges. Have any leaves, snow, or ice cleared regularly. Use sand or salt on walking paths during winter. Clean up any spills in your garage right away. This includes oil or grease spills. What can I do in the  bathroom? Use night lights. Install grab bars by the toilet and in the tub and shower. Do not use towel bars as grab bars. Use non-skid mats or decals in the tub or shower. If you need to sit down in the shower, use a plastic, non-slip stool. Keep the floor dry. Clean up any water that spills on the floor as soon as it happens. Remove soap buildup in the tub or shower regularly. Attach bath mats securely with double-sided non-slip rug tape. Do not have throw rugs and other things on the floor that can make you trip. What can I do in the bedroom? Use night lights. Make sure that you have a light by your bed that is easy to reach. Do not use any sheets or blankets that are too big for your bed. They should not hang down onto the floor. Have a firm chair that has side arms. You can use this for support while you get dressed. Do not have throw rugs and other things on the floor that can make you trip. What can I do in the kitchen? Clean up any spills right away. Avoid walking on wet floors. Keep items that  you use a lot in easy-to-reach places. If you need to reach something above you, use a strong step stool that has a grab bar. Keep electrical cords out of the way. Do not use floor polish or wax that makes floors slippery. If you must use wax, use non-skid floor wax. Do not have throw rugs and other things on the floor that can make you trip. What can I do with my stairs? Do not leave any items on the stairs. Make sure that there are handrails on both sides of the stairs and use them. Fix handrails that are broken or loose. Make sure that handrails are as long as the stairways. Check any carpeting to make sure that it is firmly attached to the stairs. Fix any carpet that is loose or worn. Avoid having throw rugs at the top or bottom of the stairs. If you do have throw rugs, attach them to the floor with carpet tape. Make sure that you have a light switch at the top of the stairs and the  bottom of the stairs. If you do not have them, ask someone to add them for you. What else can I do to help prevent falls? Wear shoes that: Do not have high heels. Have rubber bottoms. Are comfortable and fit you well. Are closed at the toe. Do not wear sandals. If you use a stepladder: Make sure that it is fully opened. Do not climb a closed stepladder. Make sure that both sides of the stepladder are locked into place. Ask someone to hold it for you, if possible. Clearly mark and make sure that you can see: Any grab bars or handrails. First and last steps. Where the edge of each step is. Use tools that help you move around (mobility aids) if they are needed. These include: Canes. Walkers. Scooters. Crutches. Turn on the lights when you go into a dark area. Replace any light bulbs as soon as they burn out. Set up your furniture so you have a clear path. Avoid moving your furniture around. If any of your floors are uneven, fix them. If there are any pets around you, be aware of where they are. Review your medicines with your doctor. Some medicines can make you feel dizzy. This can increase your chance of falling. Ask your doctor what other things that you can do to help prevent falls. This information is not intended to replace advice given to you by your health care provider. Make sure you discuss any questions you have with your health care provider. Document Released: 08/18/2009 Document Revised: 03/29/2016 Document Reviewed: 11/26/2014 Elsevier Interactive Patient Education  2017 [employer].

## 2022-12-03 NOTE — Progress Notes (Signed)
Subjective:   Ricky Garcia is a 72 y.o. male who presents for Medicare Annual/Subsequent preventive examination. I connected with  Ricky Garcia on 12/03/22 by a audio enabled telemedicine application and verified that I am speaking with the correct person using two identifiers.  Patient Location: Home  Provider Location: Home Office  I discussed the limitations of evaluation and management by telemedicine. The patient expressed understanding and agreed to proceed.  Review of Systems     Cardiac Risk Factors include: advanced age (>18men, >84 women);hypertension;male gender;dyslipidemia     Objective:    Today's Vitals   12/03/22 1318  Weight: 215 lb (97.5 kg)  Height: 5\' 8"  (1.727 m)   Body mass index is 32.69 kg/m.     12/03/2022    1:21 PM 11/29/2021    1:36 PM 09/30/2018    5:48 PM  Advanced Directives  Does Patient Have a Medical Advance Directive? No No No  Would patient like information on creating a medical advance directive? No - Patient declined No - Patient declined     Current Medications (verified) Outpatient Encounter Medications as of 12/03/2022  Medication Sig   diclofenac (VOLTAREN) 75 MG EC tablet Take 1 tablet (75 mg total) by mouth 2 (two) times daily.   lisinopril (ZESTRIL) 20 MG tablet Take 1 tablet (20 mg total) by mouth daily.   rosuvastatin (CRESTOR) 10 MG tablet Take 1 tablet (10 mg total) by mouth daily.   No facility-administered encounter medications on file as of 12/03/2022.    Allergies (verified) Penicillins   History: History reviewed. No pertinent past medical history. History reviewed. No pertinent surgical history. Family History  Problem Relation Age of Onset   Cancer Father        Emphezema   Social History   Socioeconomic History   Marital status: Divorced    Spouse name: Not on file   Number of children: Not on file   Years of education: Not on file   Highest education level: Not on file  Occupational  History   Not on file  Tobacco Use   Smoking status: Never   Smokeless tobacco: Never  Vaping Use   Vaping Use: Never used  Substance and Sexual Activity   Alcohol use: Yes    Comment: rare   Drug use: No   Sexual activity: Not on file  Other Topics Concern   Not on file  Social History Narrative   Not on file   Social Determinants of Health   Financial Resource Strain: Low Risk  (12/03/2022)   Overall Financial Resource Strain (CARDIA)    Difficulty of Paying Living Expenses: Not hard at all  Food Insecurity: No Food Insecurity (12/03/2022)   Hunger Vital Sign    Worried About Running Out of Food in the Last Year: Never true    [place] in the Last Year: Never true  Transportation Needs: No Transportation Needs (12/03/2022)   PRAPARE - Hydrologist (Medical): No    Lack of Transportation (Non-Medical): No  Physical Activity: Insufficiently Active (12/03/2022)   Exercise Vital Sign    Days of Exercise per Week: 2 days    Minutes of Exercise per Session: 30 min  Stress: No Stress Concern Present (12/03/2022)   [place]    Feeling of Stress : Not at all  Social Connections: Moderately Isolated (12/03/2022)   Social Connection and Isolation Panel [NHANES]  Frequency of Communication with Friends and Family: More than three times a week    Frequency of Social Gatherings with Friends and Family: More than three times a week    Attends Religious Services: 1 to 4 times per year    Active Member of [employer] or Organizations: No    Attends Music therapist: Never    Marital Status: Divorced    Tobacco Counseling Counseling given: Not Answered   Clinical Intake:  Pre-visit preparation completed: Yes  Pain : No/denies pain     Nutritional Risks: None Diabetes: No  How often do you need to have someone help you when you read instructions, pamphlets, or other  written materials from your doctor or pharmacy?: 1 - Never  Diabetic?no   Interpreter Needed?: No  Information entered by :: Jadene Pierini, LPN   Activities of Daily Living    12/03/2022    1:21 PM  In your present state of health, do you have any difficulty performing the following activities:  Hearing? 0  Vision? 0  Difficulty concentrating or making decisions? 0  Walking or climbing stairs? 0  Dressing or bathing? 0  Doing errands, shopping? 0  Preparing Food and eating ? N  Using the Toilet? N  In the past six months, have you accidently leaked urine? N  Do you have problems with loss of bowel control? N  Managing your Medications? N  Managing your Finances? N  Housekeeping or managing your Housekeeping? N    Patient Care Team: Sharion Balloon, FNP as PCP - General (Family Medicine)  Indicate any recent Medical Services you may have received from other than [facility] providers in the past year (date may be approximate).     Assessment:   This is a routine wellness examination for Ricky Garcia.  Hearing/Vision screen Vision Screening - Comments:: Wears rx glasses - up to date with routine eye exams with  Dr.Johnson   Dietary issues and exercise activities discussed: Current Exercise Habits: Home exercise routine, Type of exercise: walking, Time (Minutes): 30, Frequency (Times/Week): 2, Weekly Exercise (Minutes/Week): 60, Intensity: Mild, Exercise limited by: None identified   Goals Addressed             This Visit's Progress    Exercise 3x per week (30 min per time)   On track      Depression Screen    12/03/2022    1:20 PM 09/18/2022   11:54 AM 09/04/2022   10:49 AM 01/04/2022    2:40 PM 11/29/2021    1:34 PM 11/15/2021   11:57 AM 01/11/2021   12:02 PM  PHQ 2/9 Scores  PHQ - 2 Score 0 0 0 2 0 0 0  PHQ- 9 Score  0 0 2  0     Fall Risk    12/03/2022    1:19 PM 09/18/2022   11:54 AM 09/04/2022   10:50 AM 01/04/2022    2:40 PM 11/29/2021    1:36 PM  [place] in the past year? 0 0 0 0 0  Number falls in past yr: 0    0  Injury with Fall? 0    0  Risk for fall due to : No Fall Risks    No Fall Risks  Follow up Falls prevention discussed    Falls prevention discussed    FALL RISK PREVENTION PERTAINING TO THE HOME:  Any stairs in or around the home? No  If so, are there any without  handrails? No  Home free of loose throw rugs in walkways, pet beds, electrical cords, etc? Yes  Adequate lighting in your home to reduce risk of falls? Yes   ASSISTIVE DEVICES UTILIZED TO PREVENT FALLS:  Life alert? No  Use of a cane, walker or w/c? No  Grab bars in the bathroom? No  Shower chair or bench in shower? No  Elevated toilet seat or a handicapped toilet? No       12/03/2022    1:22 PM 11/29/2021    1:38 PM  6CIT Screen  What Year? 0 points 0 points  What month? 0 points 0 points  What time? 0 points 0 points  Count back from 20 0 points 0 points  Months in reverse 0 points 2 points  Repeat phrase 0 points 0 points  Total Score 0 points 2 points    Immunizations Immunization History  Administered Date(s) Administered   Fluad Quad(high Dose 65+) 12/27/2020, 09/04/2022   Influenza Inj Mdck Quad Pf 11/17/2015   Influenza, High Dose Seasonal PF 08/27/2017, 09/02/2021   Influenza,trivalent, recombinat, inj, PF 10/22/2012   Influenza-Unspecified 11/17/2015   Moderna Sars-Covid-2 Vaccination 03/21/2020, 04/18/2020   PFIZER(Purple Top)SARS-COV-2 Vaccination 06/30/2020, 07/28/2020   Pneumococcal Conjugate-13 03/20/2018   Pneumococcal Polysaccharide-23 04/20/2014, 12/27/2020   Td 10/22/2009   Td (Adult), 2 Lf Tetanus Toxid, Preservative Free 10/22/2009   Tdap 03/20/2018    TDAP status: Up to date  Flu Vaccine status: Up to date  Pneumococcal vaccine status: Up to date  Covid-19 vaccine status: Completed vaccines  Qualifies for Shingles Vaccine? Yes   Zostavax completed No   Shingrix Completed?: No.    Education has been  provided regarding the importance of this vaccine. Patient has been advised to call insurance company to determine out of pocket expense if they have not yet received this vaccine. Advised may also receive vaccine at local pharmacy or Health Dept. Verbalized acceptance and understanding.  Screening Tests Health Maintenance  Topic Date Due   Zoster Vaccines- Shingrix (1 of 2) Never done   COVID-19 Vaccine (5 - 2023-24 season) 07/06/2022   Fecal DNA (Cologuard)  09/19/2023 (Originally 01/11/1996)   Medicare Annual Wellness (AWV)  12/04/2023   DTaP/Tdap/Td (4 - Td or Tdap) 03/20/2028   Pneumonia Vaccine 83+ Years old  Completed   INFLUENZA VACCINE  Completed   Hepatitis C Screening  Completed   HPV VACCINES  Aged Out    Health Maintenance  Health Maintenance Due  Topic Date Due   Zoster Vaccines- Shingrix (1 of 2) Never done   COVID-19 Vaccine (5 - 2023-24 season) 07/06/2022    Colorectal cancer screening: Referral to GI placed 12/03/2022. Pt aware the office will call re: appt.  Lung Cancer Screening: (Low Dose CT Chest recommended if Age 62-80 years, 30 pack-year currently smoking OR have quit w/in 15years.) does not qualify.   Lung Cancer Screening Referral: n/a  Additional Screening:  Hepatitis C Screening: does not qualify;   Vision Screening: Recommended annual ophthalmology exams for early detection of glaucoma and other disorders of the eye. Is the patient up to date with their annual eye exam?  Yes  Who is the provider or what is the name of the office in which the patient attends annual eye exams? Dr.Johnson  If pt is not established with a provider, would they like to be referred to a provider to establish care? No .   Dental Screening: Recommended annual dental exams for proper oral hygiene  [employer]  Referral / Chronic Care Management: CRR required this visit?  No   CCM required this visit?  No      Plan:     I have personally reviewed and noted the  following in the patient's chart:   Medical and social history Use of alcohol, tobacco or illicit drugs  Current medications and supplements including opioid prescriptions. Patient is not currently taking opioid prescriptions. Functional ability and status Nutritional status Physical activity Advanced directives List of other physicians Hospitalizations, surgeries, and ER visits in previous 12 months Vitals Screenings to include cognitive, depression, and falls Referrals and appointments  In addition, I have reviewed and discussed with patient certain preventive protocols, quality metrics, and best practice recommendations. A written personalized care plan for preventive services as well as general preventive health recommendations were provided to patient.     Lorrene Reid, LPN   3/32/9518   Nurse Notes: Referral for Colonoscopy 12/03/2022

## 2023-02-02 DIAGNOSIS — L03221 Cellulitis of neck: Secondary | ICD-10-CM | POA: Diagnosis not present

## 2023-02-02 DIAGNOSIS — L0211 Cutaneous abscess of neck: Secondary | ICD-10-CM | POA: Diagnosis not present

## 2023-07-23 ENCOUNTER — Other Ambulatory Visit: Payer: Self-pay | Admitting: Family Medicine

## 2023-07-23 DIAGNOSIS — E782 Mixed hyperlipidemia: Secondary | ICD-10-CM

## 2023-08-06 ENCOUNTER — Encounter: Payer: Self-pay | Admitting: Family

## 2023-08-06 ENCOUNTER — Other Ambulatory Visit: Payer: Self-pay | Admitting: Family

## 2023-08-06 DIAGNOSIS — E782 Mixed hyperlipidemia: Secondary | ICD-10-CM

## 2023-08-06 NOTE — Telephone Encounter (Signed)
[facility] pt NTBS 30-d given 07/23/23

## 2023-08-06 NOTE — Telephone Encounter (Signed)
NA. Letter mailed 

## 2023-09-03 ENCOUNTER — Ambulatory Visit: Payer: Medicare HMO | Admitting: Nurse Practitioner

## 2023-09-03 NOTE — Progress Notes (Deleted)
Established Patient Office Visit  Subjective   Patient ID: Ricky Garcia, male    DOB: 05-Nov-1951  Age: 72 y.o. MRN: [id number]  No chief complaint on file.   HPI  Patient Active Problem List   Diagnosis Date Noted   Class 1 obesity with serious comorbidity and body mass index (BMI) of 32.0 to 32.9 in adult 09/18/2022   Primary hypertension 01/11/2021   Osteoarthritis of left shoulder 01/11/2021   Acute bilateral low back pain without sciatica 05/07/2018   Hyperlipemia 03/24/2018   No past medical history on file. No past surgical history on file. Social History   Tobacco Use   Smoking status: Never   Smokeless tobacco: Never  Vaping Use   Vaping status: Never Used  Substance Use Topics   Alcohol use: Yes    Comment: rare   Drug use: No   Social History   Socioeconomic History   Marital status: Divorced    Spouse name: Not on file   Number of children: Not on file   Years of education: Not on file   Highest education level: Not on file  Occupational History   Not on file  Tobacco Use   Smoking status: Never   Smokeless tobacco: Never  Vaping Use   Vaping status: Never Used  Substance and Sexual Activity   Alcohol use: Yes    Comment: rare   Drug use: No   Sexual activity: Not on file  Other Topics Concern   Not on file  Social History Narrative   Not on file   Social Determinants of Health   Financial Resource Strain: Low Risk  (12/03/2022)   Overall Financial Resource Strain (CARDIA)    Difficulty of Paying Living Expenses: Not hard at all  Food Insecurity: No Food Insecurity (12/03/2022)   Hunger Vital Sign    Worried About Running Out of Food in the Last Year: Never true    Ran Out of Food in the Last Year: Never true  Transportation Needs: No Transportation Needs (12/03/2022)   PRAPARE - Administrator, Civil Service (Medical): No    Lack of Transportation (Non-Medical): No  Physical Activity: Insufficiently Active (12/03/2022)    Exercise Vital Sign    Days of Exercise per Week: 2 days    Minutes of Exercise per Session: 30 min  Stress: No Stress Concern Present (12/03/2022)   [employer] of Occupational Health - Occupational Stress Questionnaire    Feeling of Stress : Not at all  Social Connections: Moderately Isolated (12/03/2022)   Social Connection and Isolation Panel [NHANES]    Frequency of Communication with Friends and Family: More than three times a week    Frequency of Social Gatherings with Friends and Family: More than three times a week    Attends Religious Services: 1 to 4 times per year    Active Member of [employer] or Organizations: No    Attends Banker Meetings: Never    Marital Status: Divorced  Catering manager Violence: Not At Risk (12/03/2022)   Humiliation, Afraid, Rape, and Kick questionnaire    Fear of Current or Ex-Partner: No    Emotionally Abused: No    Physically Abused: No    Sexually Abused: No   Family Status  Relation Name Status   Mother  Alive   Father  Deceased  No partnership data on file   Family History  Problem Relation Age of Onset   Cancer Father  Emphezema   Allergies  Allergen Reactions   Penicillins       ROS Negative unless indicated in HPI   Objective:     There were no vitals taken for this visit. BP Readings from Last 3 Encounters:  11/20/22 132/82  09/18/22 125/68  09/04/22 (!) 168/92   Wt Readings from Last 3 Encounters:  12/03/22 215 lb (97.5 kg)  09/18/22 214 lb 12.8 oz (97.4 kg)  09/04/22 211 lb (95.7 kg)      Physical Exam   No results found for any visits on 09/03/23.  Last CBC Lab Results  Component Value Date   WBC 8.0 09/18/2022   HGB 14.0 09/18/2022   HCT 41.5 09/18/2022   MCV 92 09/18/2022   MCH 31.0 09/18/2022   RDW 12.5 09/18/2022   PLT 367 09/18/2022   Last metabolic panel Lab Results  Component Value Date   GLUCOSE 81 09/18/2022   NA 143 09/18/2022   K 4.6 09/18/2022   CL 106  09/18/2022   CO2 24 09/18/2022   BUN 14 09/18/2022   CREATININE 0.92 09/18/2022   EGFR 89 09/18/2022   CALCIUM 9.5 09/18/2022   PROT 6.8 09/18/2022   ALBUMIN 4.2 09/18/2022   LABGLOB 2.6 09/18/2022   AGRATIO 1.6 09/18/2022   BILITOT 0.6 09/18/2022   ALKPHOS 111 09/18/2022   AST 26 09/18/2022   ALT 25 09/18/2022   Last lipids Lab Results  Component Value Date   CHOL 134 09/18/2022   HDL 32 (L) 09/18/2022   LDLCALC 73 09/18/2022   TRIG 169 (H) 09/18/2022   CHOLHDL 4.2 09/18/2022   Last hemoglobin A1c Lab Results  Component Value Date   HGBA1C 5.4 03/20/2018   Last thyroid functions Lab Results  Component Value Date   TSH 2.500 09/18/2022   T4TOTAL 6.0 09/04/2022        Assessment & Plan:  There are no diagnoses linked to this encounter.  Continue healthy lifestyle choices, including diet (rich in fruits, vegetables, and lean proteins, and low in salt and simple carbohydrates) and exercise (at least 30 minutes of moderate physical activity daily).     The above assessment and management plan was discussed with the patient. The patient verbalized understanding of and has agreed to the management plan. Patient is aware to call the clinic if they develop any new symptoms or if symptoms persist or worsen. Patient is aware when to return to the clinic for a follow-up visit. Patient educated on when it is appropriate to go to the emergency department.  No follow-ups on file.    Arrie Aran Santa Lighter, DNP [facility] [address] [place], [place] [address] [phone]

## 2023-09-27 ENCOUNTER — Other Ambulatory Visit: Payer: Self-pay | Admitting: Family Medicine

## 2023-09-27 DIAGNOSIS — R42 Dizziness and giddiness: Secondary | ICD-10-CM

## 2023-09-27 DIAGNOSIS — I10 Essential (primary) hypertension: Secondary | ICD-10-CM

## 2023-09-27 MED ORDER — LISINOPRIL 20 MG PO TABS: 20.0000 mg | ORAL_TABLET | Freq: Every day | ORAL | 0 refills | Status: DC

## 2023-09-27 NOTE — Telephone Encounter (Signed)
I called pt & made him an appt w/[facility] on 10-17-23 at 11:25am for med refill.

## 2023-09-27 NOTE — Addendum Note (Signed)
Addended by: Julious Payer D on: 09/27/2023 03:37 PM   Modules accepted: Orders

## 2023-09-27 NOTE — Telephone Encounter (Signed)
Marcelino Duster NTBS Last OV 09/18/22 NO RF sent to pharmacy last OV greater than a year

## 2023-10-11 ENCOUNTER — Other Ambulatory Visit: Payer: Self-pay | Admitting: Family

## 2023-10-11 DIAGNOSIS — E782 Mixed hyperlipidemia: Secondary | ICD-10-CM

## 2023-10-17 ENCOUNTER — Ambulatory Visit: Payer: Medicare HMO | Admitting: Family

## 2023-10-21 ENCOUNTER — Ambulatory Visit: Payer: Medicare HMO | Admitting: Family

## 2023-10-21 ENCOUNTER — Encounter: Payer: Self-pay | Admitting: Family

## 2023-10-21 VITALS — BP 120/70 | HR 67 | Temp 97.3°F | Ht 68.0 in | Wt 201.6 lb

## 2023-10-21 DIAGNOSIS — I10 Essential (primary) hypertension: Secondary | ICD-10-CM

## 2023-10-21 DIAGNOSIS — E782 Mixed hyperlipidemia: Secondary | ICD-10-CM

## 2023-10-21 DIAGNOSIS — Z0000 Encounter for general adult medical examination without abnormal findings: Secondary | ICD-10-CM

## 2023-10-21 DIAGNOSIS — Z0001 Encounter for general adult medical examination with abnormal findings: Secondary | ICD-10-CM | POA: Diagnosis not present

## 2023-10-21 DIAGNOSIS — E669 Obesity, unspecified: Secondary | ICD-10-CM

## 2023-10-21 DIAGNOSIS — Z6830 Body mass index (BMI) 30.0-30.9, adult: Secondary | ICD-10-CM | POA: Diagnosis not present

## 2023-10-21 LAB — LIPID PANEL

## 2023-10-21 MED ORDER — ROSUVASTATIN CALCIUM 10 MG PO TABS: 10.0000 mg | ORAL_TABLET | Freq: Every day | ORAL | 3 refills | Status: AC

## 2023-10-21 MED ORDER — LISINOPRIL 20 MG PO TABS: 20.0000 mg | ORAL_TABLET | Freq: Every day | ORAL | 3 refills | Status: DC

## 2023-10-21 NOTE — Patient Instructions (Signed)
Hypertension, Adult High blood pressure (hypertension) is when the force of blood pumping through the arteries is too strong. The arteries are the blood vessels that carry blood from the heart throughout the body. Hypertension forces the heart to work harder to pump blood and may cause arteries to become narrow or stiff. Untreated or uncontrolled hypertension can lead to a heart attack, heart failure, a stroke, kidney disease, and other problems. A blood pressure reading consists of a higher number over a lower number. Ideally, your blood pressure should be below 120/80. The first ("top") number is called the systolic pressure. It is a measure of the pressure in your arteries as your heart beats. The second ("bottom") number is called the diastolic pressure. It is a measure of the pressure in your arteries as the heart relaxes. What are the causes? The exact cause of this condition is not known. There are some conditions that result in high blood pressure. What increases the risk? Certain factors may make you more likely to develop high blood pressure. Some of these risk factors are under your control, including: Smoking. Not getting enough exercise or physical activity. Being overweight. Having too much fat, sugar, calories, or salt (sodium) in your diet. Drinking too much alcohol. Other risk factors include: Having a personal history of heart disease, diabetes, high cholesterol, or kidney disease. Stress. Having a family history of high blood pressure and high cholesterol. Having obstructive sleep apnea. Age. The risk increases with age. What are the signs or symptoms? High blood pressure may not cause symptoms. Very high blood pressure (hypertensive crisis) may cause: Headache. Fast or irregular heartbeats (palpitations). Shortness of breath. Nosebleed. Nausea and vomiting. Vision changes. Severe chest pain, dizziness, and seizures. How is this diagnosed? This condition is diagnosed by  measuring your blood pressure while you are seated, with your arm resting on a flat surface, your legs uncrossed, and your feet flat on the floor. The cuff of the blood pressure monitor will be placed directly against the skin of your upper arm at the level of your heart. Blood pressure should be measured at least twice using the same arm. Certain conditions can cause a difference in blood pressure between your right and left arms. If you have a high blood pressure reading during one visit or you have normal blood pressure with other risk factors, you may be asked to: Return on a different day to have your blood pressure checked again. Monitor your blood pressure at home for 1 week or longer. If you are diagnosed with hypertension, you may have other blood or imaging tests to help your health care provider understand your overall risk for other conditions. How is this treated? This condition is treated by making healthy lifestyle changes, such as eating healthy foods, exercising more, and reducing your alcohol intake. You may be referred for counseling on a healthy diet and physical activity. Your health care provider may prescribe medicine if lifestyle changes are not enough to get your blood pressure under control and if: Your systolic blood pressure is above 130. Your diastolic blood pressure is above 80. Your personal target blood pressure may vary depending on your medical conditions, your age, and other factors. Follow these instructions at home: Eating and drinking  Eat a diet that is high in fiber and potassium, and low in sodium, added sugar, and fat. An example of this eating plan is called the DASH diet. DASH stands for Dietary Approaches to Stop Hypertension. To eat this way: Eat   plenty of fresh fruits and vegetables. Try to fill one half of your plate at each meal with fruits and vegetables. Eat whole grains, such as whole-wheat pasta, brown rice, or whole-grain bread. Fill about one  fourth of your plate with whole grains. Eat or drink low-fat dairy products, such as skim milk or low-fat yogurt. Avoid fatty cuts of meat, processed or cured meats, and poultry with skin. Fill about one fourth of your plate with lean proteins, such as fish, chicken without skin, beans, eggs, or tofu. Avoid pre-made and processed foods. These tend to be higher in sodium, added sugar, and fat. Reduce your daily sodium intake. Many people with hypertension should eat less than 1,500 mg of sodium a day. Do not drink alcohol if: Your health care provider tells you not to drink. You are pregnant, may be pregnant, or are planning to become pregnant. If you drink alcohol: Limit how much you have to: 0-1 drink a day for women. 0-2 drinks a day for men. Know how much alcohol is in your drink. In the [place] one drink equals one 12 oz bottle of beer (355 mL), one 5 oz glass of wine (148 mL), or one 1 oz glass of hard liquor (44 mL). Lifestyle  Work with your health care provider to maintain a healthy body weight or to lose weight. Ask what an ideal weight is for you. Get at least 30 minutes of exercise that causes your heart to beat faster (aerobic exercise) most days of the week. Activities may include walking, swimming, or biking. Include exercise to strengthen your muscles (resistance exercise), such as Pilates or lifting weights, as part of your weekly exercise routine. Try to do these types of exercises for 30 minutes at least 3 days a week. Do not use any products that contain nicotine or tobacco. These products include cigarettes, chewing tobacco, and vaping devices, such as e-cigarettes. If you need help quitting, ask your health care provider. Monitor your blood pressure at home as told by your health care provider. Keep all follow-up visits. This is important. Medicines Take over-the-counter and prescription medicines only as told by your health care provider. Follow directions carefully. Blood  pressure medicines must be taken as prescribed. Do not skip doses of blood pressure medicine. Doing this puts you at risk for problems and can make the medicine less effective. Ask your health care provider about side effects or reactions to medicines that you should watch for. Contact a health care provider if you: Think you are having a reaction to a medicine you are taking. Have headaches that keep coming back (recurring). Feel dizzy. Have swelling in your ankles. Have trouble with your vision. Get help right away if you: Develop a severe headache or confusion. Have unusual weakness or numbness. Feel faint. Have severe pain in your chest or abdomen. Vomit repeatedly. Have trouble breathing. These symptoms may be an emergency. Get help right away. Call 911. Do not wait to see if the symptoms will go away. Do not drive yourself to the hospital. Summary Hypertension is when the force of blood pumping through your arteries is too strong. If this condition is not controlled, it may put you at risk for serious complications. Your personal target blood pressure may vary depending on your medical conditions, your age, and other factors. For most people, a normal blood pressure is less than 120/80. Hypertension is treated with lifestyle changes, medicines, or a combination of both. Lifestyle changes include losing weight, eating a healthy,   low-sodium diet, exercising more, and limiting alcohol. This information is not intended to replace advice given to you by your health care provider. Make sure you discuss any questions you have with your health care provider. Document Revised: 08/29/2021 Document Reviewed: 08/29/2021 Elsevier Patient Education  2024 [employer]  

## 2023-10-21 NOTE — Progress Notes (Signed)
Subjective:    Patient ID: Ricky Garcia, male    DOB: Oct 05, 1951, 72 y.o.   MRN: [id number]  Chief Complaint  Patient presents with   Medical Management of Chronic Issues   PT presents to the office today for CPE. He reports he ran out of his medications for the last week. His BP is very elevated. He did find some Lisinopril 10 mg at home he has been taking the last few days.   Hypertension This is a chronic problem. The current episode started more than 1 year ago. The problem has been resolved since onset. Pertinent negatives include no malaise/fatigue, peripheral edema or shortness of breath. Risk factors for coronary artery disease include dyslipidemia, male gender, obesity and sedentary lifestyle. Past treatments include ACE inhibitors (has been out of medication). The current treatment provides no improvement.  Hyperlipidemia This is a chronic problem. The current episode started more than 1 year ago. The problem is uncontrolled. Recent lipid tests were reviewed and are high. Exacerbating diseases include obesity. Pertinent negatives include no shortness of breath. Current antihyperlipidemic treatment includes diet change. The current treatment provides no improvement of lipids. Risk factors for coronary artery disease include dyslipidemia, hypertension, male sex and a sedentary lifestyle.      Review of Systems  Constitutional:  Negative for malaise/fatigue.  Respiratory:  Negative for shortness of breath.   All other systems reviewed and are negative.  Family History  Problem Relation Age of Onset   Cancer Father        Emphezema   Social History   Socioeconomic History   Marital status: Divorced    Spouse name: Not on file   Number of children: Not on file   Years of education: Not on file   Highest education level: Not on file  Occupational History   Not on file  Tobacco Use   Smoking status: Never   Smokeless tobacco: Never  Vaping Use   Vaping status: Never  Used  Substance and Sexual Activity   Alcohol use: Yes    Comment: rare   Drug use: No   Sexual activity: Not on file  Other Topics Concern   Not on file  Social History Narrative   Not on file   Social Drivers of Health   Financial Resource Strain: Low Risk  (12/03/2022)   Overall Financial Resource Strain (CARDIA)    Difficulty of Paying Living Expenses: Not hard at all  Food Insecurity: No Food Insecurity (12/03/2022)   Hunger Vital Sign    Worried About Running Out of Food in the Last Year: Never true    Ran Out of Food in the Last Year: Never true  Transportation Needs: No Transportation Needs (12/03/2022)   PRAPARE - Administrator, Civil Service (Medical): No    Lack of Transportation (Non-Medical): No  Physical Activity: Insufficiently Active (12/03/2022)   Exercise Vital Sign    Days of Exercise per Week: 2 days    Minutes of Exercise per Session: 30 min  Stress: No Stress Concern Present (12/03/2022)   [employer] of Occupational Health - Occupational Stress Questionnaire    Feeling of Stress : Not at all  Social Connections: Moderately Isolated (12/03/2022)   Social Connection and Isolation Panel [NHANES]    Frequency of Communication with Friends and Family: More than three times a week    Frequency of Social Gatherings with Friends and Family: More than three times a week    Attends Religious Services:  1 to 4 times per year    Active Member of Clubs or Organizations: No    Attends Banker Meetings: Never    Marital Status: Divorced       Objective:   Physical Exam Vitals reviewed.  Constitutional:      General: He is not in acute distress.    Appearance: He is well-developed.  HENT:     Head: Normocephalic.     Right Ear: Tympanic membrane normal.     Left Ear: Tympanic membrane normal.  Eyes:     General:        Right eye: No discharge.        Left eye: No discharge.     Pupils: Pupils are equal, round, and reactive to  light.  Neck:     Thyroid: No thyromegaly.  Cardiovascular:     Rate and Rhythm: Normal rate and regular rhythm.     Heart sounds: Normal heart sounds. No murmur heard. Pulmonary:     Effort: Pulmonary effort is normal. No respiratory distress.     Breath sounds: Normal breath sounds. No wheezing.  Abdominal:     General: Bowel sounds are normal. There is no distension.     Palpations: Abdomen is soft.     Tenderness: There is no abdominal tenderness.  Musculoskeletal:        General: No tenderness. Normal range of motion.     Cervical back: Normal range of motion and neck supple.  Skin:    General: Skin is warm and dry.     Findings: No erythema or rash.  Neurological:     Mental Status: He is alert and oriented to person, place, and time.     Cranial Nerves: No cranial nerve deficit.     Deep Tendon Reflexes: Reflexes are normal and symmetric.  Psychiatric:        Behavior: Behavior normal.        Thought Content: Thought content normal.        Judgment: Judgment normal.      BP 120/70   Pulse 67   Temp (!) 97.3 F (36.3 C) (Temporal)   Ht 5\' 8"  (1.727 m)   Wt 201 lb 9.6 oz (91.4 kg)   BMI 30.65 kg/m      Assessment & Plan:   Ricky Garcia comes in today with chief complaint of Medical Management of Chronic Issues   Diagnosis and orders addressed:  1. Primary hypertension Restart lisinopril 20 mg -Daily blood pressure log given with instructions on how to fill out and told to bring to next visit -Dash diet information given -Exercise encouraged - Stress Management  -Continue current meds - lisinopril (ZESTRIL) 20 MG tablet; Take 1 tablet (20 mg total) by mouth daily.  Dispense: 90 tablet; Refill: 3 - CMP14+EGFR - CBC with Differential/Platelet  2. Mixed hyperlipidemia - rosuvastatin (CRESTOR) 10 MG tablet; Take 1 tablet (10 mg total) by mouth daily.  Dispense: 90 tablet; Refill: 3 - CMP14+EGFR - CBC with Differential/Platelet - Lipid panel  3.  Annual physical exam (Primary)  - CMP14+EGFR - CBC with Differential/Platelet - Lipid panel - PSA, total and free  4. Obesity (BMI 30-39.9)  - CMP14+EGFR - CBC with Differential/Platelet   Labs pending Health Maintenance reviewed Diet and exercise encouraged  Follow up plan: 6 months    Jannifer Rodney, FNP

## 2023-10-22 LAB — LIPID PANEL
Cholesterol, Total: 123 mg/dL (ref 100–199)
HDL: 38 mg/dL — ABNORMAL LOW (ref >39)
LDL CALC COMMENT:: 3.2 ratio (ref 0.0–5.0)
LDL Chol Calc (NIH): 68 mg/dL (ref 0–99)
Triglycerides: 86 mg/dL (ref 0–149)
VLDL Cholesterol Cal: 17 mg/dL (ref 5–40)

## 2023-10-22 LAB — CBC WITH DIFFERENTIAL/PLATELET
Basophils Absolute: 0 10*3/uL (ref 0.0–0.2)
Basos: 0 %
EOS (ABSOLUTE): 0.1 10*3/uL (ref 0.0–0.4)
Eos: 2 %
Hematocrit: 41.5 % (ref 37.5–51.0)
Hemoglobin: 13.2 g/dL (ref 13.0–17.7)
Immature Grans (Abs): 0 10*3/uL (ref 0.0–0.1)
Immature Granulocytes: 0 %
Lymphocytes Absolute: 1.5 10*3/uL (ref 0.7–3.1)
Lymphs: 22 %
MCH: 29.7 pg (ref 26.6–33.0)
MCHC: 31.8 g/dL (ref 31.5–35.7)
MCV: 94 fL (ref 79–97)
Monocytes Absolute: 0.5 10*3/uL (ref 0.1–0.9)
Monocytes: 7 %
Neutrophils Absolute: 4.6 10*3/uL (ref 1.4–7.0)
Neutrophils: 69 %
Platelets: 358 10*3/uL (ref 150–450)
RBC: 4.44 x10E6/uL (ref 4.14–5.80)
RDW: 12.8 % (ref 11.6–15.4)
WBC: 6.9 10*3/uL (ref 3.4–10.8)

## 2023-10-22 LAB — CMP14+EGFR
ALT: 25 IU/L (ref 0–44)
AST: 27 IU/L (ref 0–40)
Albumin: 4.2 g/dL (ref 3.8–4.8)
Alkaline Phosphatase: 114 IU/L (ref 44–121)
BUN/Creatinine Ratio: 14 (ref 10–24)
BUN: 14 mg/dL (ref 8–27)
Bilirubin Total: 0.5 mg/dL (ref 0.0–1.2)
CO2: 22 mmol/L (ref 20–29)
Calcium: 9.3 mg/dL (ref 8.6–10.2)
Chloride: 105 mmol/L (ref 96–106)
Creatinine, Ser: 0.97 mg/dL (ref 0.76–1.27)
Globulin, Total: 2.5 g/dL (ref 1.5–4.5)
Glucose: 80 mg/dL (ref 70–99)
Potassium: 4.2 mmol/L (ref 3.5–5.2)
Sodium: 143 mmol/L (ref 134–144)
Total Protein: 6.7 g/dL (ref 6.0–8.5)
eGFR: 83 mL/min/{1.73_m2} (ref >59)

## 2023-10-22 LAB — PSA, TOTAL AND FREE
PSA, Free Pct: 16.4 %
PSA, Free: 0.23 ng/mL
Prostate Specific Ag, Serum: 1.4 ng/mL (ref 0.0–4.0)

## 2024-07-21 ENCOUNTER — Other Ambulatory Visit: Payer: Self-pay | Admitting: Nurse Practitioner

## 2024-07-21 DIAGNOSIS — I10 Essential (primary) hypertension: Secondary | ICD-10-CM

## 2024-07-23 DIAGNOSIS — H5213 Myopia, bilateral: Secondary | ICD-10-CM | POA: Diagnosis not present

## 2024-07-23 DIAGNOSIS — H524 Presbyopia: Secondary | ICD-10-CM | POA: Diagnosis not present

## 2024-12-09 ENCOUNTER — Encounter: Payer: Self-pay | Admitting: Family

## 2024-12-09 ENCOUNTER — Other Ambulatory Visit: Payer: Self-pay | Admitting: Family

## 2024-12-09 DIAGNOSIS — E782 Mixed hyperlipidemia: Secondary | ICD-10-CM

## 2024-12-09 NOTE — Telephone Encounter (Signed)
 Christy NTBS Last OV 10/21/23 NO RF sent to pharmacy last OV greater than a year

## 2024-12-09 NOTE — Telephone Encounter (Signed)
 "  Letter sent.   "
